# Patient Record
Sex: Female | Born: 1965 | Race: Black or African American | Hispanic: No | Marital: Married | Smoking: Current every day smoker
Health system: Southern US, Community
[De-identification: ages and names within clinical notes are randomized; demographics above are authoritative.]

## PROBLEM LIST (undated history)

## (undated) DIAGNOSIS — N200 Calculus of kidney: Secondary | ICD-10-CM

## (undated) DIAGNOSIS — E119 Type 2 diabetes mellitus without complications: Secondary | ICD-10-CM

## (undated) DIAGNOSIS — I10 Essential (primary) hypertension: Secondary | ICD-10-CM

## (undated) HISTORY — PX: OTHER SURGICAL HISTORY: SHX169

---

## 2004-08-22 ENCOUNTER — Emergency Department: Payer: Self-pay | Admitting: Emergency Medicine

## 2005-03-26 ENCOUNTER — Emergency Department: Payer: Self-pay | Admitting: Internal Medicine

## 2005-12-25 ENCOUNTER — Emergency Department: Payer: Self-pay

## 2006-06-14 ENCOUNTER — Emergency Department: Payer: Self-pay | Admitting: Emergency Medicine

## 2006-07-27 ENCOUNTER — Emergency Department: Payer: Self-pay | Admitting: General Practice

## 2006-12-06 ENCOUNTER — Observation Stay: Payer: Self-pay | Admitting: Urology

## 2008-02-13 ENCOUNTER — Emergency Department: Payer: Self-pay | Admitting: Emergency Medicine

## 2008-03-02 ENCOUNTER — Emergency Department: Payer: Self-pay | Admitting: Emergency Medicine

## 2008-05-31 ENCOUNTER — Emergency Department: Payer: Self-pay | Admitting: Emergency Medicine

## 2008-07-04 ENCOUNTER — Emergency Department: Payer: Self-pay | Admitting: Emergency Medicine

## 2008-08-19 ENCOUNTER — Emergency Department: Payer: Self-pay | Admitting: Emergency Medicine

## 2008-09-13 ENCOUNTER — Emergency Department: Payer: Self-pay | Admitting: Emergency Medicine

## 2011-05-02 ENCOUNTER — Emergency Department: Payer: Self-pay | Admitting: Emergency Medicine

## 2011-07-29 ENCOUNTER — Emergency Department: Payer: Self-pay | Admitting: Emergency Medicine

## 2013-06-27 ENCOUNTER — Inpatient Hospital Stay: Payer: Self-pay | Admitting: Internal Medicine

## 2013-06-27 LAB — URINALYSIS, COMPLETE
Bacteria: NONE SEEN
Bilirubin,UR: NEGATIVE
Glucose,UR: NEGATIVE mg/dL (ref 0–75)
Ketone: NEGATIVE
Leukocyte Esterase: NEGATIVE
Nitrite: NEGATIVE
Ph: 5 (ref 4.5–8.0)
RBC,UR: 4 /HPF (ref 0–5)
Specific Gravity: 1.018 (ref 1.003–1.030)
Squamous Epithelial: 5
WBC UR: 5 /HPF (ref 0–5)

## 2013-06-27 LAB — COMPREHENSIVE METABOLIC PANEL
Albumin: 2.8 g/dL — ABNORMAL LOW (ref 3.4–5.0)
Alkaline Phosphatase: 135 U/L — ABNORMAL HIGH
Anion Gap: 14 (ref 7–16)
BILIRUBIN TOTAL: 3 mg/dL — AB (ref 0.2–1.0)
BUN: 19 mg/dL — ABNORMAL HIGH (ref 7–18)
CALCIUM: 7.9 mg/dL — AB (ref 8.5–10.1)
CHLORIDE: 104 mmol/L (ref 98–107)
CO2: 17 mmol/L — AB (ref 21–32)
Creatinine: 1.58 mg/dL — ABNORMAL HIGH (ref 0.60–1.30)
EGFR (Non-African Amer.): 38 — ABNORMAL LOW
GFR CALC AF AMER: 44 — AB
GLUCOSE: 139 mg/dL — AB (ref 65–99)
Osmolality: 275 (ref 275–301)
Potassium: 3.9 mmol/L (ref 3.5–5.1)
SGOT(AST): 96 U/L — ABNORMAL HIGH (ref 15–37)
SGPT (ALT): 85 U/L — ABNORMAL HIGH (ref 12–78)
Sodium: 135 mmol/L — ABNORMAL LOW (ref 136–145)
Total Protein: 6.5 g/dL (ref 6.4–8.2)

## 2013-06-27 LAB — CBC
HCT: 28.4 % — ABNORMAL LOW (ref 35.0–47.0)
HGB: 9.6 g/dL — AB (ref 12.0–16.0)
MCH: 28.2 pg (ref 26.0–34.0)
MCHC: 33.8 g/dL (ref 32.0–36.0)
MCV: 83 fL (ref 80–100)
Platelet: 155 10*3/uL (ref 150–440)
RBC: 3.4 10*6/uL — ABNORMAL LOW (ref 3.80–5.20)
RDW: 12.9 % (ref 11.5–14.5)
WBC: 28.7 10*3/uL — AB (ref 3.6–11.0)

## 2013-06-27 LAB — PREGNANCY, URINE: PREGNANCY TEST, URINE: NEGATIVE m[IU]/mL

## 2013-06-27 LAB — LIPASE, BLOOD: LIPASE: 51 U/L — AB (ref 73–393)

## 2013-06-27 LAB — GC/CHLAMYDIA PROBE AMP

## 2013-06-28 ENCOUNTER — Ambulatory Visit: Payer: Self-pay

## 2013-06-28 LAB — BASIC METABOLIC PANEL
Anion Gap: 6 — ABNORMAL LOW (ref 7–16)
BUN: 13 mg/dL (ref 7–18)
CALCIUM: 7.7 mg/dL — AB (ref 8.5–10.1)
CO2: 23 mmol/L (ref 21–32)
CREATININE: 1.13 mg/dL (ref 0.60–1.30)
Chloride: 110 mmol/L — ABNORMAL HIGH (ref 98–107)
GFR CALC NON AF AMER: 57 — AB
Glucose: 146 mg/dL — ABNORMAL HIGH (ref 65–99)
OSMOLALITY: 280 (ref 275–301)
POTASSIUM: 3.1 mmol/L — AB (ref 3.5–5.1)
SODIUM: 139 mmol/L (ref 136–145)

## 2013-06-28 LAB — CBC WITH DIFFERENTIAL/PLATELET
BASOS PCT: 0.2 %
Basophil #: 0 10*3/uL (ref 0.0–0.1)
EOS PCT: 0 %
Eosinophil #: 0 10*3/uL (ref 0.0–0.7)
HCT: 22.1 % — AB (ref 35.0–47.0)
HGB: 7.5 g/dL — AB (ref 12.0–16.0)
Lymphocyte #: 1.6 10*3/uL (ref 1.0–3.6)
Lymphocyte %: 9.3 %
MCH: 28.3 pg (ref 26.0–34.0)
MCHC: 34.1 g/dL (ref 32.0–36.0)
MCV: 83 fL (ref 80–100)
MONOS PCT: 6.1 %
Monocyte #: 1.1 x10 3/mm — ABNORMAL HIGH (ref 0.2–0.9)
Neutrophil #: 14.8 10*3/uL — ABNORMAL HIGH (ref 1.4–6.5)
Neutrophil %: 84.4 %
PLATELETS: 119 10*3/uL — AB (ref 150–440)
RBC: 2.66 10*6/uL — ABNORMAL LOW (ref 3.80–5.20)
RDW: 12.9 % (ref 11.5–14.5)
WBC: 17.6 10*3/uL — ABNORMAL HIGH (ref 3.6–11.0)

## 2013-06-28 LAB — HEPATIC FUNCTION PANEL A (ARMC)
AST: 30 U/L (ref 15–37)
Albumin: 2 g/dL — ABNORMAL LOW (ref 3.4–5.0)
Alkaline Phosphatase: 107 U/L
BILIRUBIN TOTAL: 1.2 mg/dL — AB (ref 0.2–1.0)
Bilirubin, Direct: 0.6 mg/dL — ABNORMAL HIGH (ref 0.00–0.20)
SGPT (ALT): 49 U/L (ref 12–78)
TOTAL PROTEIN: 5.8 g/dL — AB (ref 6.4–8.2)

## 2013-06-29 LAB — CBC WITH DIFFERENTIAL/PLATELET
BASOS PCT: 0.3 %
Basophil #: 0 10*3/uL (ref 0.0–0.1)
EOS PCT: 0.3 %
Eosinophil #: 0 10*3/uL (ref 0.0–0.7)
HCT: 20 % — AB (ref 35.0–47.0)
HGB: 6.9 g/dL — AB (ref 12.0–16.0)
Lymphocyte #: 2.7 10*3/uL (ref 1.0–3.6)
Lymphocyte %: 19.3 %
MCH: 28.8 pg (ref 26.0–34.0)
MCHC: 34.3 g/dL (ref 32.0–36.0)
MCV: 84 fL (ref 80–100)
Monocyte #: 0.7 x10 3/mm (ref 0.2–0.9)
Monocyte %: 5.2 %
NEUTROS ABS: 10.3 10*3/uL — AB (ref 1.4–6.5)
NEUTROS PCT: 74.9 %
Platelet: 107 10*3/uL — ABNORMAL LOW (ref 150–440)
RBC: 2.38 10*6/uL — AB (ref 3.80–5.20)
RDW: 13 % (ref 11.5–14.5)
WBC: 13.8 10*3/uL — ABNORMAL HIGH (ref 3.6–11.0)

## 2013-06-29 LAB — COMPREHENSIVE METABOLIC PANEL
ANION GAP: 5 — AB (ref 7–16)
Albumin: 1.7 g/dL — ABNORMAL LOW (ref 3.4–5.0)
Alkaline Phosphatase: 84 U/L
BILIRUBIN TOTAL: 0.6 mg/dL (ref 0.2–1.0)
BUN: 11 mg/dL (ref 7–18)
CREATININE: 1.13 mg/dL (ref 0.60–1.30)
Calcium, Total: 7.6 mg/dL — ABNORMAL LOW (ref 8.5–10.1)
Chloride: 113 mmol/L — ABNORMAL HIGH (ref 98–107)
Co2: 20 mmol/L — ABNORMAL LOW (ref 21–32)
EGFR (African American): >60
EGFR (Non-African Amer.): 57 — ABNORMAL LOW
Glucose: 130 mg/dL — ABNORMAL HIGH (ref 65–99)
OSMOLALITY: 277 (ref 275–301)
POTASSIUM: 3.3 mmol/L — AB (ref 3.5–5.1)
SGOT(AST): 23 U/L (ref 15–37)
SGPT (ALT): 33 U/L (ref 12–78)
SODIUM: 138 mmol/L (ref 136–145)
Total Protein: 5.5 g/dL — ABNORMAL LOW (ref 6.4–8.2)

## 2013-06-29 LAB — URINE CULTURE

## 2013-06-30 LAB — HEMOGLOBIN: HGB: 8.1 g/dL — ABNORMAL LOW (ref 12.0–16.0)

## 2013-06-30 LAB — MAGNESIUM: Magnesium: 1.7 mg/dL — ABNORMAL LOW

## 2013-06-30 LAB — POTASSIUM: Potassium: 3.6 mmol/L (ref 3.5–5.1)

## 2013-07-01 LAB — CREATININE, SERUM
Creatinine: 0.92 mg/dL (ref 0.60–1.30)
EGFR (Non-African Amer.): >60

## 2013-07-01 LAB — RAPID HIV-1/2 QL/CONFIRM: HIV-1/2,Rapid Ql: NEGATIVE

## 2013-07-01 LAB — HEMOGLOBIN: HGB: 8.7 g/dL — ABNORMAL LOW (ref 12.0–16.0)

## 2013-07-01 LAB — CULTURE, BLOOD (SINGLE)

## 2013-07-02 LAB — VANCOMYCIN, TROUGH: Vancomycin, Trough: 4 ug/mL — ABNORMAL LOW (ref 10–20)

## 2013-07-08 ENCOUNTER — Ambulatory Visit: Payer: Self-pay | Admitting: Urology

## 2013-11-29 ENCOUNTER — Emergency Department: Payer: Self-pay | Admitting: Emergency Medicine

## 2014-06-09 ENCOUNTER — Emergency Department: Payer: Self-pay | Admitting: Emergency Medicine

## 2014-08-24 ENCOUNTER — Emergency Department: Payer: Self-pay | Admitting: Emergency Medicine

## 2014-09-28 ENCOUNTER — Inpatient Hospital Stay
Admit: 2014-09-28 | Disposition: A | Discharge status: Home / Self Care | Payer: Self-pay | Attending: Internal Medicine | Admitting: Internal Medicine

## 2014-09-28 LAB — COMPREHENSIVE METABOLIC PANEL
ALK PHOS: 103 U/L
ANION GAP: 9 (ref 7–16)
Albumin: 4.4 g/dL
BILIRUBIN TOTAL: 0.2 mg/dL — AB
BUN: 16 mg/dL
CHLORIDE: 107 mmol/L
CREATININE: 1.14 mg/dL — AB
Calcium, Total: 9.5 mg/dL
Co2: 23 mmol/L
EGFR (African American): >60
EGFR (Non-African Amer.): 56 — ABNORMAL LOW
GLUCOSE: 116 mg/dL — AB
POTASSIUM: 3.8 mmol/L
SGOT(AST): 23 U/L
SGPT (ALT): 23 U/L
Sodium: 139 mmol/L
TOTAL PROTEIN: 8.1 g/dL

## 2014-09-28 LAB — CBC WITH DIFFERENTIAL/PLATELET
BASOS PCT: 0.8 %
Basophil #: 0.1 10*3/uL (ref 0.0–0.1)
Eosinophil #: 0.5 10*3/uL (ref 0.0–0.7)
Eosinophil %: 3.7 %
HCT: 43.1 % (ref 35.0–47.0)
HGB: 14.4 g/dL (ref 12.0–16.0)
LYMPHS ABS: 5.7 10*3/uL — AB (ref 1.0–3.6)
LYMPHS PCT: 41.3 %
MCH: 28.1 pg (ref 26.0–34.0)
MCHC: 33.5 g/dL (ref 32.0–36.0)
MCV: 84 fL (ref 80–100)
MONO ABS: 0.8 x10 3/mm (ref 0.2–0.9)
Monocyte %: 5.5 %
Neutrophil #: 6.7 10*3/uL — ABNORMAL HIGH (ref 1.4–6.5)
Neutrophil %: 48.7 %
Platelet: 294 10*3/uL (ref 150–440)
RBC: 5.12 10*6/uL (ref 3.80–5.20)
RDW: 12.6 % (ref 11.5–14.5)
WBC: 13.7 10*3/uL — ABNORMAL HIGH (ref 3.6–11.0)

## 2014-09-28 LAB — URINALYSIS, COMPLETE
BLOOD: NEGATIVE
Bacteria: NONE SEEN
Bilirubin,UR: NEGATIVE
Glucose,UR: NEGATIVE mg/dL (ref 0–75)
Ketone: NEGATIVE
Leukocyte Esterase: NEGATIVE
NITRITE: NEGATIVE
PROTEIN: NEGATIVE
Ph: 7 (ref 4.5–8.0)
SPECIFIC GRAVITY: 1.015 (ref 1.003–1.030)

## 2014-09-29 LAB — BASIC METABOLIC PANEL
ANION GAP: 0 — AB (ref 7–16)
BUN: 14 mg/dL
CREATININE: 1.12 mg/dL — AB
Calcium, Total: 8.7 mg/dL — ABNORMAL LOW
Chloride: 111 mmol/L
Co2: 26 mmol/L
EGFR (African American): >60
GFR CALC NON AF AMER: 58 — AB
GLUCOSE: 96 mg/dL
Potassium: 4 mmol/L
Sodium: 137 mmol/L

## 2014-09-29 LAB — MAGNESIUM: Magnesium: 1.9 mg/dL

## 2014-09-30 LAB — CBC WITH DIFFERENTIAL/PLATELET
BASOS PCT: 0.3 %
Basophil #: 0 10*3/uL (ref 0.0–0.1)
Eosinophil #: 0.4 10*3/uL (ref 0.0–0.7)
Eosinophil %: 4.3 %
HCT: 37.4 % (ref 35.0–47.0)
HGB: 12.6 g/dL (ref 12.0–16.0)
LYMPHS PCT: 48 %
Lymphocyte #: 4.3 10*3/uL — ABNORMAL HIGH (ref 1.0–3.6)
MCH: 28.4 pg (ref 26.0–34.0)
MCHC: 33.6 g/dL (ref 32.0–36.0)
MCV: 85 fL (ref 80–100)
Monocyte #: 0.5 x10 3/mm (ref 0.2–0.9)
Monocyte %: 5.3 %
NEUTROS PCT: 42.1 %
Neutrophil #: 3.8 10*3/uL (ref 1.4–6.5)
Platelet: 235 10*3/uL (ref 150–440)
RBC: 4.43 10*6/uL (ref 3.80–5.20)
RDW: 12.7 % (ref 11.5–14.5)
WBC: 9 10*3/uL (ref 3.6–11.0)

## 2014-09-30 LAB — URINE CULTURE

## 2014-10-03 NOTE — H&P (Signed)
PATIENT NAME:  Cassandra, Graves MR#:  [id number] DATE OF BIRTH:  1965-12-26  DATE OF ADMISSION:  06/27/2013  PRIMARY CARE PHYSICIAN: None.   EMERGENCY ROOM PHYSICIAN: Dr. Thomasene Lot.   CHIEF COMPLAINT: Abdominal pain and cough and uterine bleeding.   HISTORY OF PRESENT ILLNESS: A 49 year old female patient complains of uterine bleeding for two months. She also has cough and shortness of breath for 2 months, but according to the mother today morning, she was very weak and also tired, having fever, and because of this abdominal pain, cough, shortness of breath and fever and also generalized weakness, the patient was brought in here. The patient was at [facility] on Friday because of her uterine bleeding and she was told that she has a fibroid or cyst, and she went for follow-up on Tuesday. She still has bleeding source. She was given progesterone tablets on Tuesday. The patient is taking them but according to the mother, she is still having bleeding.   The patient also noted to have abdominal pain and cough and shortness of breath. The patient says that she is having trouble breathing and cough for about 2 months. The patient noted to have some blood in her cough today.   No chest pain and the patient has 100.1 fever today in the ER. The patient denies any diarrhea. No nausea. No vomiting. The patient has no chest pains and no problems with dysuria.   PAST MEDICAL HISTORY: Significant for history of kidney stones before. She had shockwave lithotripsy before at [facility]. She also had a history of ureteral stone removed and also stents placed and that also removed 2 years ago at [facility].   SOCIAL HISTORY: A history of smoking before but quit now. No alcohol. No drugs. She has 5 kids. She works at [employer].   PAST SURGICAL HISTORY: C-section and also shockwave lithotripsy.   MEDICATIONS: Progesterone tablets but no other medication.   FAMILY HISTORY: No hypertension or diabetes.   ALLERGIES: PENICILLIN   REVIEW OF  SYSTEMS: CONSTITUTIONAL: Had a fever and generalized weakness.  EYES: No blurred vision.  ENT: No tinnitus. No epistaxis. No difficulty swallowing.  RESPIRATIONS: Has cough for 2 months. The patient also complains of trouble breathing.  CARDIOVASCULAR: No chest pain. No orthopnea. No pedal edema. No palpitations.  GASTROINTESTINAL: Has abdominal pain mainly on the right side, and the patient has no diarrhea. No constipation. No rectal bleeding.  GENITOURINARY: No dysuria. Has a history of kidney stones before.  ENDOCRINE: No polyuria or nocturia.  HEMATOLOGIC: Has anemia. The patient has uterine bleeding going on for 2 months.  MUSCULOSKELETAL: No joint pain.  NEUROLOGIC: No numbness or weakness.  PSYCHIATRIC: No anxiety or insomnia.   PHYSICAL EXAMINATION:  VITAL SIGNS: Temperature is 100.1, heart rate 117, blood pressure 103/55, sats 97% room air. GENERAL: A 49 year old female who is looking very weak and clinically looks very dehydrated.  HEENT: PERRLA. EOM intact. The patient has no conjunctivitis. Hearing is intact. Mucous membranes are dry. The patient has no pharyngeal erythema and tympanic membranes are clear.  NECK: Supple. No JVD. No carotid bruit. No lymphadenopathy. No masses.  RESPIRATIONS: Clear to auscultation. No wheezing. No rales. The patient has slightly decreased breath sounds on the right side.  CARDIOVASCULAR: S1, S2 regular, tachycardic. PMI nondisplaced. The patient has no thrills, good pedal pulses, femoral pulse, and no extremity edema.  ABDOMEN: Right upper quadrant tenderness present. No hepatosplenomegaly. Bowel sounds present. No CVA tenderness. No hernias.   MUSCULOSKELETAL: Able to move  extremities x4.  SKIN: decreased skin turgor NEUROLOGICAL: Alert and oriented x 1. Cranial nerves II through XII are intact. Power five out of five upper and lower extremities. The patient had no dysarthria or aphasia. No contractures.  PSYCHIATRIC: Oriented to time, place,  person and cooperative.   LABORATORY AND DIAGNOSTIC DATA: CT of abdomen shows abdominal density in the left lower lobe consistent with pneumonia. Liver is normal. No calcified gallstones. Kidney is normal. Pancreas is normal. Adrenal glands are normal. Left kidney has 2 mm nonobstructing stone in the midportion, 5 mm nonobstructing stone in the lower pole. The patient also has the appearance of renal atrophy. Right kidney has focal scarring and calyceal ectasia. Nonobstructing stone in the upper pole is about 9 mm in diameter. There is also fullness in the right renal collecting system and ureter to the level of the pelvic brim which is a 4 mm stone.   Patchy density in the left lower lobe concerning for pneumonia. The patient has right ureteral stone which is 4 mm in size with mild hydronephrosis.   The patient also has somewhat enlarged areas of low density in the uterus which is a leiomyoma which could be a necrotic leiomyoma with inflammatory changes.   Lactic acid is 3.9.   Abdominal x-ray shows nonobstructive bowel gas pattern and nephrolithiasis.   WBC 28.7, hemoglobin 9.6, hematocrit 28.4, platelets 155.   ELECTROLYTES: Sodium 135, potassium 3.9, chloride 104, bicarb 17, BUN 19, creatinine 1.58, glucose 139. The patient's ALT is 85, AST 96, alk phos 135.   Albumin is 2.8. Lipase 51.   Urinalysis is clear with WBC only 5 and 2+ blood in there. Hazy-colored urine and also no bacteria.   EKG not done in the ER. I am going to order one.  ASSESSMENT AND PLAN: The patient is a 49 year old female patient with uterine bleeding, abdominal pain, elevated white count, lactic acidosis. 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 The patient was given admission for pneumonia.   1.  The patient has pneumonia definitely and also probably might have some endometriosis or infected fibroid. The patient has sepsis on admission secondary to pneumonia and also possible intra-abdominal process. The  patient will be admitted to medicine. Will give IV fluids, normal saline at 150 mL/hour. The patient has lactic acidosis with low bicarbonate. She will be getting IV fluids aggressively and continue vancomycin and Levaquin. Follow the blood cultures and see how she does.  2.  Possible intra-abdominal infection with possible endometriosis versus leiomyoma. The patient will be seen by OB/GYN. Get the record from [place] as she was there a week ago. The patient still has uterine bleeding, so I put in a consult for OB/GYN to see the patient.  3.  The patient has ureteral stone with mild hydronephrosis.  Discussed with Dr. Jacqlyn Larsen who thinks that this stone is not really causing a problem as urinalysis is clear. The patient will be started on Flomax, continue aggressive hydration. The patient has a history of kidney stones before, but at this time, acute renal failure probably due to sepsis. Continue fluids and monitor kidney function. Dr. Jacqlyn Larsen recommended to continue Flomax but no other intervention.  4.  Elevated LFTs. At this time, there is no clear reason for the elevation of LFTs, so at this time, I think it is probably due to sepsis. Follow the trend and see how she does   TIME SPENT: About 60 minutes. This is a critical history and physical.  The patient's condition discussed with the mother.   ____________________________ Epifanio Lesches, MD sk:np D: 06/27/2013 14:39:07 ET T: 06/27/2013 15:04:46 ET JOB#: [id number]  cc: Epifanio Lesches, MD, <Dictator> Epifanio Lesches MD ELECTRONICALLY SIGNED 07/22/2013 14:43

## 2014-10-03 NOTE — H&P (Signed)
PATIENT NAME:  Cassandra Graves, Cassandra Graves MR#:  [id number] DATE OF BIRTH:  1965/08/18  DATE OF ADMISSION:  06/27/2013  ADDENDUM: The patient's EKG shows sinus tachycardia, 118 beats per minute. No ST-T changes.  ____________________________ Katha HammingSnehalatha Aliyanna Wassmer, MD sk:sb D: 06/27/2013 14:45:34 ET T: 06/27/2013 15:09:07 ET JOB#: [id number]  cc: Katha HammingSnehalatha Olia Hinderliter, MD, <Dictator> Katha HammingSNEHALATHA Ayush Boulet MD ELECTRONICALLY SIGNED 07/22/2013 14:40

## 2014-10-03 NOTE — Consult Note (Signed)
Chief Complaint:  Subjective/Chief Complaint diffuse abdominal pain, weakness and coughing patient has had some chills over the last 24 hours no urinary symptoms, no dysuria or hematuria continues to have vaginal bleeding   VITAL SIGNS/ANCILLARY NOTES: **Vital Signs.:   17-Jan-15 05:00  Vital Signs Type Routine  Temperature Temperature (F) 98.8  Celsius 37.1  Temperature Source oral  Pulse Pulse 95  Respirations Respirations 20  Systolic BP Systolic BP 94  Diastolic BP (mmHg) Diastolic BP (mmHg) 59  Mean BP 70  Pulse Ox % Pulse Ox % 94  Pulse Ox Activity Level  At rest  Oxygen Delivery Room Air/ 21 %  *Intake and Output.:   17-Jan-15 00:26  Grand Totals Intake:   Output:  300    Net:  -300 24 Hr.:  -930  Urine ml     Out:  300  Urinary Method  Void; Up to BR  Stool  small loose BM    04:25  Grand Totals Intake:   Output:  400    Net:  -400 24 Hr.:  -1330  Urine ml     Out:  400  Urinary Method  Void; Up to [facility]    Shift 07:00  [facility] Intake:   Output:  700    Net:  -700 24 Hr.:  -1330  Urine ml     Out:  700  Length of Stay Totals Intake:  120 Output:  1450    Net:  -1330    Daily 07:00  Grand Totals Intake:  120 Output:  1450    Net:  -1330 24 Hr.:  -1330  Oral Intake      In:  120  Urine ml     Out:  1450  Length of Stay Totals Intake:  120 Output:  1450    Net:  -1330    07:30  Unmeasured Intake  Sips; Few bites   Brief Assessment:  GEN well developed, well nourished, uncomfortable   Cardiac Regular   Respiratory normal resp effort   Gastrointestinal details normal Soft  No gaurding  No rigidity  diffuse tenderness on palpation   EXTR negative edema   Additional Physical Exam no CVA tenderness   Radiology Results: CT:    16-Jan-15 13:12, CT Abdomen and Pelvis With Contrast  CT Abdomen and Pelvis With Contrast   REASON FOR EXAM:    (1) abd pain - RLQ, weakness, pallor, WBC 28k; (2)   abd pain - RLQ, weakness, pal  COMMENTS:        PROCEDURE: CT  - CT ABDOMEN / PELVIS  W  - Jun 27 2013  1:12PM     CLINICAL DATA:  Nausea and vomiting with weakness. Vaginal bleeding.  Elevated white count.    EXAM:  CT ABDOMEN AND PELVIS WITH CONTRAST    TECHNIQUE:  Multidetector CT imaging of the abdomen and pelvis was performed  using the standard protocol following bolus administration of  intravenous contrast.    CONTRAST:  100 cc Isovue 370    COMPARISON:  05/31/2008    FINDINGS:  There is abnormal density in the left lower lobe consistent with  pneumonia.    The liver has a normal appearance without focal lesions or biliary  ductal dilatation. No calcified gallstones. The spleen is normal.  The pancreas is normal. The adrenal glands are normal. Left kidney  contains a 2 mm nonobstructing stone in the midportion and a 5 mm  non obstructing stone in the lower pole. There  are a few areas of  focal renal atrophy/ scarring. The right kidney shows areas of focal  scarring with caliceal ectasia. There is a nonobstructing stone in  the upper pole measuring 9 mm in diameter. There is mild fullness of  the right renal collecting system and ureter to the level of the  pelvic brim where there is a 4 mm stone.    The aorta shows atherosclerosis but no aneurysm. The IVC is normal.  Uterus appears somewhat enlarged with areas of low density that are  nonspecific. These could represent necrotic leiomyoma is or  inflammatory change. Ultrasound would evaluate that more accurately.  No free fluid in the pelvis. No significant bony finding. The  appendix is well seen as a normal structure. No other bowel finding.     IMPRESSION:  Patchy density in the left lower lobe consistent with pneumonia.  Right ureteral stone at the pelvic brim measuring 4 mm in size with  mild hydroureteronephrosis on the right.    Nonobstructing calculi in both kidneys. Areas of renal scarring  bilaterally.      Electronically Signed    EA:VWUJBy:Mark   Shogry M.D.    On: 06/27/2013 13:24         Verified By: Thomasenia SalesMARK E. SHOGRY, M.D.,   Assessment/Plan:  Invasive Device Daily Assessment of Necessity:  Does the patient currently have any of the following indwelling devices? none   Assessment/Plan:  Assessment 49 yo female admitted for abdominal pain, cough, weakness and tachycardia. CT scan demonstrated 4mm right distal ureteral stone as well as possible left lower lobe pneumonia and enlarged uterus with questionable necrotic/inflammatory area.   Patient with low grade fever overnight to 100.7 and continued abdominal pain and nausea.  Patient also with positive blood cultures 2/2 with gram positive cocci.  1. Left lower lobe pna- recommend PA/LAT CXR.  Continue abx and manage per primary team.  Patient complaining of cough for last several days  2. Abnormal vaginal bleeding- gyn consulted and recommend outpatient f/u for fibroid and malignancy work up.  Unlikely source of infection per consult note  3. Elevated creatinine- improved to 1.13 with hydration.   4. Elevated WBC- 28-->17, gram positive cocci in chains in blood.  Continue abx.  Urinalysis and urine culture negative   5. Right distal ureteral stone- mild hydronephrosis, creatinine normal and no CVA tenderness on my exam.   Patient with complicated picture of infection with bacteremia and low grade fevers.  Unclear what the source of the infection is but patient does have distal ureteral stone and this could be source.    Recommend: NPO, plan for OR on Sunday for cystoscopy, right retrograde pyelogram and right ureteral stent.  Continue to monitor for worsening symptoms that would require urgent intervention.  This was discussed with the patient and her husband.  Informed consent placed in the chart.   Electronic Signatures: Janit BernMcNamara, Tamsen Reist R (MD)  (Signed 17-Jan-15 11:21)  Authored: Chief Complaint, VITAL SIGNS/ANCILLARY NOTES, Brief Assessment, Radiology Results,  Assessment/Plan   Last Updated: 17-Jan-15 11:21 by Janit BernMcNamara, Aaban Griep R (MD)

## 2014-10-03 NOTE — H&P (Signed)
PATIENT NAME:  Cassandra MelterMAPP, Buffey E MR#:  [id number] DATE OF BIRTH:  April 11, 1966  DATE OF ADMISSION:  06/27/2013  ADDENDUM  The patient's EKG shows sinus tachycardia, 118 beats per minute. No ST-T changes.  ____________________________ Katha HammingSnehalatha Skylar Flynt, MD sk:jcm D: 06/27/2013 14:45:34 ET T: 06/27/2013 14:57:25 ET JOB#: [id number]  cc: Katha HammingSnehalatha Amantha Sklar, MD, <Dictator> Katha HammingSNEHALATHA Kanchan Gal MD ELECTRONICALLY SIGNED 06/27/2013 17:53

## 2014-10-03 NOTE — Consult Note (Signed)
Brief Consult Note: Diagnosis: abnormal uterine bleeding, sepsis, acute renal failure.   Patient was seen by consultant.   Consult note dictated.   Recommend further assessment or treatment.   Orders entered.   Comments: Patient has [facility]  [facility] and [facility] are not concerning for infection.  Doubt gynecologic etiology to her infection.  Differential diagnosis would include endometritis and PID (exam by me not consistent with either).  Gynecologic malignancy is unlikely to present with sepsis and she has reassuring ultrasound findings.  Would consider other source as etiology for infection, unless new symptoms or signs become apparent.  Abnormal uterine bleeding: Her fibroids are certainly a possible cause of her bleeding.  Malignancy should also be considered.  However, this is usually accomplished in the outpatient setting.  She states that she has [facility] as an outpatient for follow up. If she develops heavy bleeding, please give us a call and we can help recommend medication and dosing with regard to her lates hepatic and renal function indices.  Thank you for this consult.  Electronic Signatures: Conard NovakJackson, Nhyira Leano D (MD)  (Signed 16-Jan-15 22:44)  Authored: Brief Consult Note   Last Updated: 16-Jan-15 22:44 by Conard NovakJackson, Tavis Kring D (MD)

## 2014-10-03 NOTE — Discharge Summary (Signed)
PATIENT NAME:  Cassandra Graves, Cassandra Graves MR#:  [id number] DATE OF BIRTH:  [birth year]  DATE OF ADMISSION:  06/27/2013 DATE OF DISCHARGE:  01/221/2015  CONSULTANTS: Dr. Achilles Dunk and  Dr. Alyce Pagan from urology. Dr. Jean Rosenthal from OB/GYN. Dr. Sampson Goon from infectious disease.   CHIEF COMPLAINT: Abdominal pain, cough, uterine bleeding.   PRIMARY CARE PHYSICIAN: None.   DISCHARGE DIAGNOSES: 1. Sepsis.  2. Strep pyogenes bacteremia.  3. Pneumonia.  4. Dysfunctional uterine bleeding.  5. Postobstructive hemorrhagic acute anemia status post a unit of PRBC transfusion.  6. Hypokalemia. 7. Mild acute renal failure, resolved.  8. Right ureteral stone with  right hydronephrosis.  9. Nephrolithiasis.  10. Transient hypotension.   DISCHARGE MEDICATIONS: Levaquin 500 mg p.o. daily for 10 days, Flomax 0.4 mg daily for 10 days, ferrous sulfate 325 mg 2 times a day, Sprintec 35 mcg/0.25 mg orally as previously dosed.   DIET: Regular.   ACTIVITY: As tolerated.   FOLLOWUP: Please follow with Dr. Achilles Dunk in about a week. Please follow with Dr. Sampson Goon on the 30th. Please follow with [facility]GYN at [facility] as previously scheduled by you on first week of February.    DISPOSITION: Home.   SIGNIFICANT LABS AND IMAGING: Initial BUN 19, creatinine 1.58, sodium 135, lowest potassium was 3.1. Initial bilirubin was at 3, alkaline phosphatase 135, AST 96, ALT 85. By January 18th had normalized. Lipase on arrival 51. Initial white count of 28,000 with hemoglobin 9.6, platelets 155. The lowest hemoglobin was 6.9, last hemoglobin of 8.7. Urine cultures no growth to date. Initial blood cultures showed pyogenous 2/2 bottles. Chlamydia negative. HIV negative. UA not suggestive of infection. Pregnancy test negative. Initial lactic acid of 3.9. Initial CT abdomen and pelvis with contrast showing patchy density in the left lower lobe consistent with pneumonia, right ureteral stone with pelvic brim measuring 4 mm with mild hydronephrosis on the  right, nonobstructing calculi in both kidneys. Areas of renal scarring bilaterally. Pelvic ultrasound showing mildly enlarged uterus with heterogeneous echotexture and fibroids in the anterior aspect of the fundus and the posterior aspect of the lower uterine segment. Endometrial stripe does not appear abnormally thickened. The left ovary could not be demonstrated. Right ovary is normal in appearance. Abdominal ultrasound no evidence of acute cholecystitis nor any acute gallbladder abnormalities. Mild hydronephrosis.   HISTORY OF PRESENT ILLNESS AND HOSPITAL COURSE: For full details of H and P, please see the dictation on day of admission by Dr. Luberta Mutter. But briefly, this is a 49 year old female who came in for shortness of breath a couple months,  weakness, lethargy, fevers, abdominal pains. She had been at [facility] on Friday for uterine bleeding and she was told that she had a fibroid or cyst and went for follow-up on Tuesday. She had bleeding still. She was given  progesterone tablets. She came in for abdominal pain. She had low-grade temperature of 100.1 in the ER and also had complained of a cough. But she had a CT done of the abdomen as dictated above which did show pneumonia. She had elevated lactic acid and was started on IV antibiotics with aggressive IV fluid resuscitation. She is also noted to have a urethral stone. Urology was consulted in addition to OB/GYN.   In regards to the sepsis, this was secondary to pneumonia, as well as strep pyogenes  bacteremia. Two blood cultures came back positive for strep and infectious disease was consulted. The sepsis has resolved. She is no longer febrile. Her white count has significantly decreased and the patient  clinically is better, and per infectious disease she will be discharged on Levaquin for additional 10 days. She is PENICILLIN ALLERGIC and therefore the Levaquin, and we will attempt to refill the prescription for her. She has no PCP. Infectious disease will  see her on the 30th of an outpatient. In regards to the pneumonia, she is on room air at this point and that has significantly improved. We did an echocardiogram which did not suggest endocarditis.   In regards to elevated transaminases with a right upper quadrant pain, we did an abdominal ultrasound. That was nondiagnostic. I suspected that the pain could be secondary to the fibroids versus ureteral stone, which was seen in the CAT scan. At this point,  she has no further pain and the transaminases has corrected. It is possible that the elevated transaminases is secondary to sepsis.   In regards to the right ureteral stone. She does have some mild hydronephrosis. However,  kidney function is improved. She has not passed a stone yet. She was started on Flomax and she was seen by urology. There is no intervention planned at this time. She was seen by Dr. Achilles Dunkope yesterday and the plan is to follow as an outpatient in about a week and at that time it will be determined if she needs lithotripsy or ureteroscopic stone removal. If she develops any significant right-sided flank pain or significant symptoms related to her stone, other indications might be indicated at that time per urology, but at this point, she is not having  any stone pains.   In regards to her uterine bleeding. She was seen by OB/GYN  and that time has stopped. [facility] first week of February. 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 She did receive a unit of PRBC. She was counseled about following with [facility] and really make an attempt to make appointments with Dr. Achilles Dunkope and  Dr. Sampson GoonFitzgerald for her prior to discharge.   PHYSICAL EXAMINATION: VITAL SIGNS: On the day of discharge, temperature 98.5, pulse is 76, respiratory rate 18, systolic  blood pressure 131, oxygen saturation 95% on room air.  GENERAL: The patient is a  well-developed female lying in bed, eating, no obvious distress, talking in full sentences.  HEENT: Normocephalic, atraumatic. Moist mucous membranes.  HEART: Normal S1, S2 without murmurs.  LUNGS: Clear with and without, wheezing, or rhonchi.  ABDOMEN: Soft, nontender. Extremities exhibit no pitting edema.   CODE STATUS: THE PATIENT IS FULL CODE.   Total time spent on this discharge: 35 minutes.  ____________________________ Krystal EatonShayiq Mayank Teuscher, MD sa:sg D: 07/02/2013 13:23:00 ET T: 07/02/2013 13:45:13 ET JOB#: [id number]  cc: Madolyn FriezeBrian S. Achilles Dunkope, MD Stann Mainlandavid P. Sampson GoonFitzgerald, MD Krystal EatonShayiq Emidio Warrell, MD, <Dictator>   Krystal EatonSHAYIQ Riyad Keena MD ELECTRONICALLY SIGNED 07/26/2013 10:49

## 2014-10-03 NOTE — Consult Note (Signed)
PATIENT NAME:  Cassandra Graves, Cassandra Graves MR#:  [id number] DATE OF BIRTH:  1965/11/02  DATE OF CONSULTATION:  06/27/2013  REFERRING PHYSICIAN:  Epifanio Lesches, MD  CONSULTING PHYSICIAN:  Will Bonnet, MD  REASON FOR CONSULTATION:  Abnormal uterine bleeding.   HISTORY OF PRESENT ILLNESS:  Mrs. Schnick is a 49 year old G105P5 female who was admitted earlier today with severe sepsis, lactic acidosis, elevated white blood cell count, fever, acute renal failure, and pneumonia.  She also has a history of abnormal uterine bleeding.  The patient states that for the past two days she has had emesis and she has been able to keep very little down.  With emesis she has had loss of control of her bowels.  She otherwise has no issues with her bowels.  She notes no current abdominal pain though she states that she has a little soreness in her right lower quadrant from what she states is likely from her throwing up.  She denies hematochezia or hematemesis.  She states that she has been feeling feverish at home and she has had a cough for approximately one month for which she has been taking Dimetapp and that the cough has been improving.  She states that she has been seen at [facility] twice in the past week for heavy vaginal bleeding.  She was given medication to help with her bleeding which she states she started taking approximately two days ago.  She describes the medication as having to be taken 5 pills in one day for the first day, four pills the second day and she states that she started throwing up about the time that she started taking these pills.  From a menstrual standpoint she has had approximately two months of heavy vaginal bleeding.  Prior to that she had four months with no vaginal bleeding and then prior to that she would have bleeding some months yes and some months no, that would last for about two days.  She has no history of abnormal Pap smears though she cannot recall the last time that  she had a Pap smear.  She states she has no history of sexually transmitted infections.  She denies abnormal vaginal discharge and vaginal irritation.   PAST MEDICAL HISTORY:  1.  Notable for nephrolithiasis.  2.  Abnormal uterine bleeding with fibroids.   PAST SURGICAL HISTORY: 1.  Cesarean section x 2.  2.  ESWL.  3.  Ureteroscopy.  4.  Bilateral tubal ligation.   CURRENT MEDICATIONS:  1.  Combined oral contraceptive pills.  2.  Progesterone tablets.   ALLERGIES:  PENICILLIN, WHICH CAUSED HIVES.   OBSTETRICAL AND GYNECOLOGIC HISTORY:  The patient is a G5, P5-0-05.  She is vaginally parous x 3 with two cesarean sections.  Her youngest child is 85 years old.  Her Pap smear and STD history as per the HPI.   SOCIAL HISTORY:  Denies current tobacco, alcohol and drug use.  She currently works at [employer].   FAMILY HISTORY:  Notable for mother with colon cancer diagnosed at the age of 42.  She denies any other GYN cancers, breast cancers and other colon cancers in her family.    REVIEW OF SYSTEMS: Notable for generalized weakness and otherwise negative x 10 systems reviewed unless noted in the HPI.   PHYSICAL EXAMINATION: VITAL SIGNS:  Temperature 38.1 Celsius, pulse 105, blood pressure 125/78, respiratory rate 20, O2 saturation 95% on room air.  GENERAL:  The patient is a bit tired-appearing, but is  in no apparent acute distress.  HEENT:  Normocephalic and atraumatic.  NECK:  Trachea is midline.  No thyromegaly.  CARDIOVASCULAR:  Regular rate and rhythm.  PULMONARY:  Has restricted breathing pattern with diminished breath sounds at the bases.  ABDOMEN:  Obese, soft, is nontender, nondistended.  There is mild tenderness in the right lower quadrant.  No right upper quadrant tenderness.  Negative Murphy sign.  No rebound or guarding.  GENITOURINARY:  Pelvic exam reveals normal external female genitalia.  Normal Bartholin's, urethra and Skene glands.  The vaginal epithelium is pink and  normally rugated.  There is a small amount of dark old blood in the vaginal vault.  The cervix is visualized and has an old blood clot at the external os which is wiped away.  There were no obvious lesions and no active bright red bleeding from the cervical os.  On bimanual examination there is no cervical motion tenderness.  No lesions in the vagina or cervix and there is no uterine tenderness.  No adnexal fullness or tenderness. The uterine and adnexal assessment is somewhat limited by her body habitus.  EXTREMITIES:  No erythema, cords or tenderness.  SKIN:  There is decreased turgor.  NEUROLOGIC:  Grossly intact.   LABORATORY RESULTS:   1.  BMP significant for BUN of 19, creatinine of 1.6, sodium 135, potassium 3.9, chloride 104, EGFR 44, anion gap is 14, calcium 7.9, lipase 51.   2. LFTs with total bilirubin of 3.0, alkaline phosphatase 135, AST 96, ALT 85.  3.  CBC:  White blood cell count 28.7, hemoglobin 9.6, hematocrit 28.4, platelets 155, MCV is 83 4. lactic acid is 3.9.   IMAGING STUDIES:  (pertinent to gynecologic evaluation): 1.  CT scan obtained on 01/16 from report shows that the uterus appears somewhat enlarged with areas of low density that are nonspecific possibly representing necrotic leiomyoma or inflammatory change.  No free fluid in the pelvis.  2.  Pelvic ultrasound accomplished on 06/27/2013 from report shows the uterus measuring 10.1 x 5.4 x 6.8 cm.  Within the uterus anteriorly there is a presumed fibroid that measures 5.7 x 2.8 x 4.3 cm and posteriorly in the lower uterine segment there is a 1.2 x 1.2 x 1.3 cm diameter lesion that likely represents fibroid per report.  Endometrium is 6.9 mm with no focal abnormality visualized.  Right ovary measures 2.6 x 1 x 1.4 cm with no adnexal masses and abnormal appearance.  Left ovary could not be seen.  No free fluid noted in the pelvis.   ASSESSMENT AND RECOMMENDATIONS:  This is a 49 year old G5, P5 female who is admitted for severe  sepsis likely from pneumonia, renal failure, elevated lactic acid, and a history of abnormal uterine bleeding.   1.  Infection risk from gynecologic standpoint: She has heavy abnormal uterine bleeding for which she has been evaluated twice in the past week in addition to my evaluation.  Her ultrasound findings here and at [facility] are not concerning for infection.  I do doubt any gynecologic etiology for her infectious morbidity, though the differential diagnosis would include endometritis and pelvic inflammatory disease.  My exam is not consistent with either one.  Gynecologic malignancy may present with sepsis in certain situations.  However, she has reassuring ultrasound findings that are not suggestive of malignancy that would lead to an infection.  I would consider other sources and etiology of infection prior to considering gynecologic source unless new symptoms or signs become apparent.   2.  Abnormal uterine bleeding:  Her fibroids are certainly possible cause of her bleeding.  Malignancy should also be considered.  However, this is usually accomplished in an outpatient setting.  The patient does note that she has an appointment at [facility] on February 5th, next month as an outpatient for follow-up.  If she develops heavy bleeding, please feel free to call the on-call gynecologist for help with recommendation for medication and dosing depending on her latest hepatic and renal function indices, if needed.    Thank you very much for this consultation.  We appreciate the opportunity to participate in the care of your patient.      ____________________________ Will Bonnet, MD sdj:ea D: 06/27/2013 23:00:08 ET T: 06/27/2013 23:58:17 ET JOB#: [id number]  cc: Will Bonnet, MD, <Dictator> Will Bonnet MD ELECTRONICALLY SIGNED 06/28/2013 8:05

## 2014-10-03 NOTE — Consult Note (Signed)
Patient seen yesterday evening. I was unable to [facility] the [facility] system remotely to write the note. This is the first time I have been able to access the system to document the information. Most of her discomfort at present seems to be more pleuritic in nature. She has improved significantly with the IV hydration and antibiotic therapy. She is having minimal symptoms related to her stone at present. We can give her up to 2 weeks to allow for an attempt at spontaneous stone passage. Continuation of the Flomax is reasonable. She states that she has not passed any stones previously spontaneously. Every stone can be different.  If the stone has not passed within 2 weeks, surgical intervention would then be recommended. I would be very hesitant to utilize anesthesia at this point with the pneumonia and sepsis unless absolutely indicated. This has been discussed at length with Ms. Litaker. Follow-up on an outpatient basis from a GYN standpoint is also reasonable at this point. She should have a follow-up in the office in approximately one week with K[facility]. We will make a determination at that time whether to proceed with lithotripsy or ureteroscopic stone removal. If she develops any significant right-sided flank pain or other significant symptoms related to her stone, other intervention may be indicated at that point. Please feel free to contact us with any other questions.  Electronic Signatures: Smith Robertope, Zamirah Denny S (MD)  (Signed on 21-Jan-15 12:30)  Authored  Last Updated: 21-Jan-15 12:30 by Smith Robertope, Geoffery Aultman S (MD)

## 2014-10-03 NOTE — Consult Note (Signed)
Patient: Cassandra Graves  Date of birth: 07-03-1965  Date of consult: 06/27/2013  Reason for consult: Ureterolithiasis  History: Ms. Crotteau is a 49 year old left American female with a history of nephrolithiasis.  She underwent lithotripsy by Dr. Artis Flock in 2008.  She is also had ureteroscopic stone removal.  She has not been experiencing any flank pain or discomfort suggestive of stone passage.  She did not even know that she had a stone.  There is a 9 mm stone in the right kidney.  There are at least 2 small stones within the left kidney the largest being approximately 5 mm.  There is a 4 mm stone in the lower right ureter near the uterus with mild hydronephrosis.  There is no perinephric stranding.  Her urine is clear.  She just had a urine culture at [facility] 3 days prior which demonstrated only some mixed bacteria.  She has been experiencing bloody vaginal discharge for the past 2 months.  She was seen recently at [facility] emergency room.  She did undergo an endopelvic ultrasound.  This demonstrated no overall significant findings.  On today's CT scan, there is significant thickening of the uterus with some abnormal density changes.  She had gone a number of months without a menstrual cycle.  This bleeding that began.  She is likely perimenopausal.  Infection or malignancy must be considered in this situation.  She has had overall progressive worsening.  Her current acidosis has worsened since her evaluation at [facility] several days prior.  Most of her discomfort has been lower abdominal.  There does appear to be a possible pneumonia on CT scan.  This may be just 1 contributing factor.  Her symptoms, urine findings, and physical findings are not consistent with the stone as the source of her current issues.  There is a reasonable chance of spontaneous stone passage.  If she does have worsening fever with no other definitive source despite antibiotic therapy, stent placement may then be reasonable.  This has been  discussed with Ms. Hawkes.  She was given a Depo-Provera shot in the emergency room.  She had been instructed to follow-up with gynecology for further evaluation of the GYN issues.  Past medical history: Nephrolithiasis, dysfunctional uterine bleeding  Past surgical history: Cesarean section, ESWL, ureteroscopy, bilateral tubal ligation  Allergies: Penicillin  Medications on admission: Ortho Tri-Cyclen  Physical examination:  Temp: 100.1             Respirations: 20                Heart Rate: 119                   Blood Pressure: 124/61 Gen.: Alert but somewhat drowsy, oriented 3 HEENT: Within normal limits Chest: Clear to auscultation bilaterally.  Possible slight decreased breath sounds in the left lower region Cardiovascular: Regular rate and rhythm Abdomen: Soft, mild tenderness in the suprapubic region.  No palpable masses, no appreciable CVA tenderness Extremities: Free range of motion 4 Neurology: Motor and sensory grossly intact  Assessment: Right ureterolithiasis, mild hydronephrosis, acute renal insufficiency likely secondary to other health issues, dysfunctional uterine bleeding with possible infection versus malignancy, left lower lobe pneumonia  Recommendation: She was having no symptoms of stone passage.  She did not even know that she had a stone with her history of stones.  The dysfunctional bleeding and uterine issues are likely the source of her current problem.  The obstructing stone could be extruding factor to  her renal function.  With the clear urine and culture, I do not feel that stent placement is absolutely indicated at present.  There is a reasonable chance of spontaneous stone passage.  Recommend Flomax 0.4 mg daily.  Strain urine and save stone if passed.  If there is significant worsening of renal function despite IV hydration and antibiotic therapy, stent placement could then be considered.  Monitoring is otherwise all that is indicated.  I will forward her  information to weekends call.  GYN evaluation is recommended from the bleeding standpoint.  [facility].  She will likely need ESWL to the large right renal calculus.  She would likely benefit from a metabolic stone evaluation once she is recovered from these issues.  If there is no spontaneous passage within 2 weeks, ureteroscopic stone removal within be recommended.  Please feel free to contact us if there are any further questions.  Electronic Signatures: Smith Robertope, Melinda Gwinner S (MD)  (Signed on 16-Jan-15 14:52)  Authored  Last Updated: 16-Jan-15 14:52 by Smith Robertope, Delorus Langwell S (MD)

## 2014-10-03 NOTE — Consult Note (Signed)
PATIENT NAME:  Cassandra Graves, Cassandra Graves MR#:  [id number] DATE OF BIRTH:  [birth year]  DATE OF CONSULTATION:  06/30/2013  REFERRING PHYSICIAN:  Katharina Caperima Vaickute, MD CONSULTING PHYSICIAN:  Stann Mainlandavid P. Sampson GoonFitzgerald, MD  REASON FOR CONSULTATION: Bacteremia as well as pneumonia.   HISTORY OF PRESENT ILLNESS: This is a pleasant 49 year old female with a history of dysfunctional uterine bleeding, being worked up by GI. She also has a history of kidney stones status post [facility]. She was admitted on January 16 with complaints of uterine bleeding as well as progressive shortness of breath and cough and onset of fever, abdominal pain, nausea, vomiting, generalized weakness. She had recently undergone work-up for this uterine bleeding. On admission she was found to have a fever. She was also tachycardic, hypotensive. CT scan of the abdomen showed pneumonia in her left lower lobe. She also had an elevated lactic acid and elevated white count.   Since admission, the patient has been treated with antibiotics, and has clinically improved. Blood cultures are growing group A strep. Currently she has minimal cough, no chest pain. She is not having any shortness of breath. She is still quite a weak.   PAST MEDICAL HISTORY: 1. Dysfunctional uterine bleeding as above.  2.  Prior nephrolithiasis, status post lithotripsy and stenting.   PAST SURGICAL HISTORY: C-section and shock wave lithotripsy and stenting of her ureter.   SOCIAL HISTORY: Smoking, but has quit. No alcohol, drugs. She works at [employer]. She lives with her son. She has five children.   MEDICATIONS: On admission included progesterone. Antibiotics since admission: Include levofloxacin on January 17, vancomycin on January 16, 17 and 18, ertapenem on January 15, levofloxacin also on January 15. Other medications currently include: Tylenol, Zofran, Flomax, cough syrup Tussionex, morphine, IV vancomycin, levofloxacin 750 q. 48, magnesium oxide,  pantoprazole.   REVIEW OF SYSTEMS: Eleven systems reviewed and negative except as per history of present illness.   FAMILY HISTORY: Noncontributory.   PHYSICAL EXAMINATION: VITAL SIGNS: T-max over the last 24 hours 99.1, pulse is 70, blood pressure 126/69 and sat is 95% on room air.  GENERAL: She is pleasant, interactive, sitting in bed in no acute distress. She is obese.  HEENT: Pupils equal, round, reactive to light and accommodation. Extraocular movements are intact. Sclerae are anicteric. Her oropharynx is clear.  NECK: Supple.  HEART: Regular. She does have some decreased breath sounds bilateral bases. This this is greater on the left with some bronchial breath sounds.  ABDOMEN: Obese, soft, nontender. No rebound, guarding, she has no flank tenderness.  EXTREMITIES: No clubbing, cyanosis or edema.   LABORATORY, DIAGNOSTIC AND RADIOLOGIC DATA:  White blood count on admission was 28.7, it has come down to 13.8 yesterday, hemoglobin is 8.1. Platelets yesterday were 107. Differential on January 18, when the white count was 13.8 showed neutrophil 10.3, lymphocytes 2.7. Blood cultures x 2 January 16 grew group A strep. GC, chlamydia testing was negative. Urine clean catch was negative. Urinalysis on admission showed only four white cells. Pregnancy test was negative. LFTs showed elevation initially with an AST of 96, ALT 85, but that has resolved. T-bili was elevated at 3.0, but has come down to 0.6. Lipase was 51 on admission. Renal function shows a creatinine currently of 1.13.   Chest x-ray done on January 16, showed left lower lobe pneumonia.   CT of her abdomen and pelvis done on January 16, showed abnormal density in the left lower lobe consistent with pneumonia. There was a right  urethral stone as well, 4 mm in size and mild hydroureter nephrosis on the left.   Ultrasound of her uterus showed mildly enlarged uterus. There was normal stripe. The right ovary was normal. Left could not be  visualized.   The abdominal ultrasound done for elevated LFTs showed no evidence of acute cholecystitis. There was mild hydronephrosis.   IMPRESSION: A 49 year old admitted with clinical sepsis and bacteremia with group A strep. She has pneumonia in her left lower lung based on x-ray and CT.  She also has mild renal stone and mild hydronephrosis. Her white count has improved remarkably. She is afebrile and off of oxygen. Clinically, she is much improved.   RECOMMENDATIONS: 1.  I put her in for an HIV test.  2.  She is likely to be complete a 10 to 14 day course with antibiotics to cover at the group A strep. I suspect she may have had a viral illness and then developed group A strep pneumonia on top of it. She is currently on levofloxacin. I would continue that and then if she continues to improve, we could potentially discharge her on this. She is penicillin allergic so I would not recommend using amoxicillin or Augmentin.   Thank you for the consult. I will be glad to follow with you   ____________________________ Stann Mainland. Sampson Goon, MD dpf:cc D: 06/30/2013 16:38:41 ET T: 06/30/2013 18:43:24 ET JOB#: [id number]  cc: Stann Mainland. Sampson Goon, MD, <Dictator> [name] Sampson Goon MD ELECTRONICALLY SIGNED 07/02/2013 21:19

## 2014-10-03 NOTE — Consult Note (Signed)
Chief Complaint:  Subjective/Chief Complaint Patient says she feels better this morning.  No fevers or chills overnight.  Still complaining of cough.  She continues to have some nausea with emesis last night.  Patient states her pain is in her lower abdomen on both sides.  She denies dysuria or hematuria.  She continues to have vaginal bleeding.   Brief Assessment:  GEN no acute distress   Cardiac Regular   Respiratory normal resp effort   Gastrointestinal details normal Soft  No rebound tenderness  No gaurding  No rigidity  +tenderness to palpation in lower quadrant L>R   EXTR negative edema   Additional Physical Exam no CVA tenderness   Lab Results: Hepatic:  17-Jan-15 09:02   Bilirubin, Total  1.2  Alkaline Phosphatase 107 (45-117 NOTE: New Reference Range 05/02/13)  SGPT (ALT) 49  SGOT (AST) 30  Total Protein, Serum  5.8  Albumin, Serum  2.0  Bilirubin, Direct  0.60 (Result(s) reported on 28 Jun 2013 at 09:37AM.)  18-Jan-15 05:09   Bilirubin, Total 0.6  Alkaline Phosphatase 84 (45-117 NOTE: New Reference Range 05/02/13)  SGPT (ALT) 33  SGOT (AST) 23  Total Protein, Serum  5.5  Albumin, Serum  1.7  Routine Chem:  17-Jan-15 05:44   Glucose, Serum  146  BUN 13  Creatinine (comp) 1.13  Sodium, Serum 139  Potassium, Serum  3.1  Chloride, Serum  110  CO2, Serum 23  Calcium (Total), Serum  7.7  Osmolality (calc) 280  eGFR (African American) >60  eGFR (Non-African American)  57 (eGFR values <78m/min/1.73 m2 may be an indication of chronic kidney disease (CKD). Calculated eGFR is useful in patients with stable renal function. The eGFR calculation will not be reliable in acutely ill patients when serum creatinine is changing rapidly. It is not useful in  patients on dialysis. The eGFR calculation may not be applicable to patients at the low and high extremes of body sizes, pregnant women, and vegetarians.)  Anion Gap  6  18-Jan-15 05:09   Glucose, Serum  130   BUN 11  Creatinine (comp) 1.13  Sodium, Serum 138  Potassium, Serum  3.3  Chloride, Serum  113  CO2, Serum  20  Calcium (Total), Serum  7.6  Osmolality (calc) 277  eGFR (African American) >60  eGFR (Non-African American)  57 (eGFR values <668mmin/1.73 m2 may be an indication of chronic kidney disease (CKD). Calculated eGFR is useful in patients with stable renal function. The eGFR calculation will not be reliable in acutely ill patients when serum creatinine is changing rapidly. It is not useful in  patients on dialysis. The eGFR calculation may not be applicable to patients at the low and high extremes of body sizes, pregnant women, and vegetarians.)  Anion Gap  5  Routine Hem:  17-Jan-15 05:44   WBC (CBC)  17.6  RBC (CBC)  2.66  Hemoglobin (CBC)  7.5  Hematocrit (CBC)  22.1  Platelet Count (CBC)  119  MCV 83  MCH 28.3  MCHC 34.1  RDW 12.9  Neutrophil % 84.4  Lymphocyte % 9.3  Monocyte % 6.1  Eosinophil % 0.0  Basophil % 0.2  Neutrophil #  14.8  Lymphocyte # 1.6  Monocyte #  1.1  Eosinophil # 0.0  Basophil # 0.0 (Result(s) reported on 28 Jun 2013 at 06:53AM.)  18-Jan-15 05:09   WBC (CBC)  13.8  RBC (CBC)  2.38  Hemoglobin (CBC)  6.9  Hematocrit (CBC)  20.0  Platelet Count (CBC)  107  MCV 84  MCH 28.8  MCHC 34.3  RDW 13.0  Neutrophil % 74.9  Lymphocyte % 19.3  Monocyte % 5.2  Eosinophil % 0.3  Basophil % 0.3  Neutrophil #  10.3  Lymphocyte # 2.7  Monocyte # 0.7  Eosinophil # 0.0  Basophil # 0.0 (Result(s) reported on 29 Jun 2013 at 05:54AM.)   Radiology Results: XRay:    17-Jan-15 11:45, Chest PA and Lateral  Chest PA and Lateral   REASON FOR EXAM:    pneumonia?  COMMENTS:       PROCEDURE: DXR - DXR CHEST PA (OR AP) AND LATERAL  - Jun 28 2013 11:45AM     CLINICAL DATA:  Possible pneumonia    EXAM:  CHEST  2 VIEW    COMPARISON:  None.    FINDINGS:  Heart size and vascular pattern are normal. Right lung is clear.  There is infiltrate  posteriorly in the medial left lower lobe.   IMPRESSION:  Left lower lobe pneumonia. Radiographic followup after appropriate  therapy recommended.      Electronically Signed    By: Skipper Cliche M.D.    On: 06/28/2013 11:59         Verified By: Rachael Fee, M.D.,  [place]:    17-Jan-15 13:51, US Abdomen Limited Survey  US Abdomen Limited Survey   REASON FOR EXAM:    sepsis, elevated t. bili, transminases, ?   cholecystitis  COMMENTS:   Body Site: Gallbladder, Liver, Common Bile Duct    PROCEDURE: [place]  - US ABDOMEN LIMITED SURVEY  - Jun 28 2013  1:51PM     CLINICAL DATA:  Clinical sepsis, elevated hepatic function studies,  possible acute cholecystitis.    EXAM:  US ABDOMEN LIMITED - RIGHT UPPER QUADRANT    COMPARISON:  None.    FINDINGS:  Gallbladder    The gallbladder is adequately distended with no evidence of stones,  wall thickening, or pericholecystic fluid. There is no positive  sonographic Murphy's sign.    Common bile duct    Diameter: Normal at 3.8 mm    Liver:    The liver exhibits no focal mass nor ductal dilation. Portal venous  flow appears normal in direction toward the liver. No ascites is  demonstrated in the right upper quadrant of the abdomen.  Limited views of the right kidney reveal mild hydronephrosis.     IMPRESSION:  There is no evidence of acute cholecystitis nor other acute  gallbladder abnormality. The common bile duct as well as the liver  exhibit no acute abnormalities either. Mild hydronephrosis is  present.      Electronically Signed    By: David  Martinique    On: 06/28/2013 13:52         Verified By: DAVID A. [place], M.D., MD   Assessment/Plan:  Invasive Device Daily Assessment of Necessity:  Does the patient currently have any of the following indwelling devices? none   Assessment/Plan:  Assessment 49 yo female admitted for abdominal pain, cough, weakness and tachycardia. CT scan demonstrated 55m right distal  ureteral stone as well as possible left lower lobe pneumonia and enlarged uterus with questionable necrotic/inflammatory area.   Patient afebrile overnight.  Continues to complain of cough.  Patient also with positive blood cultures 2/2 with gram positive cocci/beta strep.  1. Left lower lobe pna- Chest xray confirms pna.  Continue abx and manage per primary team.  Patient complaining of cough for last several days  2.  Abnormal vaginal bleeding- gyn consulted and recommend outpatient f/u for fibroid and malignancy work up.  Unlikely source of infection per consult note  3. Elevated creatinine- improved to 1.13 with hydration.   4. Elevated WBC- 28-->17-->13.8, gram positive cocci in chains in blood. Discussed with microbiology-most likely beta strep. Await speciation.  Continue abx.  Consult ID. Urinalysis and urine culture negative   5. Right distal ureteral stone- mild hydronephrosis, creatinine normal and no CVA tenderness on my exam. Bacteremia with gram positive cocci/beta strep unlikely urinary source. Physical exam and symptoms not c/w renal colic.  Continue flomax  Assessment: 49 yo female with complicated picture of infection with bacteremia and evidence of left lower lobe pneumonia. Awaiting speciation of beta strep in blood cultures.    Recommend: Discussed with Dr. Ether Griffins.  Patient stable and improving overnight.  Would continue to treat for pneumonia and bacteremia.  Await speciation.  Unlikely urinary source so no urgency for ureteral stent placement.  Will continue to follow and inform urologist on call Monday.  Patient will need treatment for stone if she does not pass it on her own. And if patient worsens will need to consider right ureteral stent.  Discussed with patient.   Electronic Signatures: Margy Clarks (MD)  (Signed 18-Jan-15 09:54)  Authored: Chief Complaint, Brief Assessment, Lab Results, Radiology Results, Assessment/Plan   Last Updated: 18-Jan-15 09:54 by  Margy Clarks (MD)

## 2014-10-11 NOTE — Op Note (Signed)
Patient: This 49 year old F had a surgical procedure performed on 29-Sep-2014.  Post Operative Report:  Pre-Op Diagnosis right ureteral stone   Post-Op Diagnosis Same   Operation cystoscopy, right retrograde pyelogram with interpretation, right ureteral stent placement   Anesthesia General   Specimen Type None   Findings large filling defect in the mid-ureter consistent with patient's known stone with proximal hydronephrosis.   Surgeon Berniece SalinesBenjamin Kirstine Jacquin   EBL: Minimal   Complications None   Description of Procedure: Patient was consented prior to being brought back to the operating room. SHe was then brought back to the operating room placed on the table in supine position. General anesthesia was then induced and endotracheal tube inserted. This then placed in dorsolithotomy position and prepped and draped in the routine sterile fashion. A timeout was held.  Using a 22.5 [place] cystoscope with a 30 ?? lens, I gently passed the scope into the patient's urethra and into the bladder under visual guidance. A 360?? cystoscopic evaluation was performed with no mucosal abnormalities, no tumors, and no foreign bodies identified. I then passed a 0. 038 Sensor wire into the right ureteral orifice and into the right renal pelvis. I then slid a 5 [place] open-ended ureteral catheter over the wire and into the renal pelvis and removed the wire.   Next I injected 10 cc of contrast into the renal collecting system and performed a retrograde pyelogram. I then replaced the wire through the open-ended ureteral catheter and remove the catheter. I then slid a 24 cm x 6 French double-J ureteral stent over the wire and into the renal pelvis under fluoroscopic guidance. Once a nice curl was noted in the renal pelvis I advanced the distal end of the stent into the bladder before removing the wire completely.   A final fluoroscopic image was obtained confirming the curl in the renal pelvis as well as a curl in the  bladder.   Electronic Signatures: Crist FatHerrick, Deberah Adolf W (MD)  (Signed 19-Apr-16 20:11)  Authored: Patient and Date/Time, Operative Note   Last Updated: 19-Apr-16 20:11 by Crist FatHerrick, Katiya Fike W (MD)

## 2014-10-11 NOTE — Consult Note (Signed)
Chief Complaint:  Subjective/Chief Complaint complaining of dysuria and occatinal flank pain, improved since prior to stent.   VITAL SIGNS/ANCILLARY NOTES: **Vital Signs.:   20-Apr-16 16:35  Vital Signs Type Q 8hr  Temperature Temperature (F) 98.4  Celsius 36.8  Temperature Source oral  Pulse Pulse 75  Respirations Respirations 18  Systolic BP Systolic BP 160  Diastolic BP (mmHg) Diastolic BP (mmHg) 89  Mean BP 112  Pulse Ox % Pulse Ox % 99  Pulse Ox Activity Level  At rest  Oxygen Delivery Room Air/ 21 %  *Intake and Output.:   Daily 20-Apr-16 07:00  Grand Totals Intake:  2859 Output:  1600    Net:  1259 24 Hr.:  1259  Oral Intake      In:  240  IV (Primary)      In:  2619  IV (Secondary)      In:  0  Urine ml     Out:  1600  Length of Stay Totals Intake:  3734 Output:  1600    Net:  2134   Brief Assessment:  GEN well developed, well nourished, no acute distress   Cardiac -- LE edema   Respiratory no use of accessory muscles   Gastrointestinal Normal   Gastrointestinal details normal Soft  Nontender  Nondistended  No masses palpable  Bowel sounds normal  No CVA tenderness   EXTR negative cyanosis/clubbing, negative edema   Lab Results: Routine Hem:  20-Apr-16 04:32   WBC (CBC) 9.0  RBC (CBC) 4.43  Hemoglobin (CBC) 12.6  Hematocrit (CBC) 37.4  Platelet Count (CBC) 235  MCV 85  MCH 28.4  MCHC 33.6  RDW 12.7  Neutrophil % 42.1  Lymphocyte % 48.0  Monocyte % 5.3  Eosinophil % 4.3  Basophil % 0.3  Neutrophil # 3.8  Lymphocyte #  4.3  Monocyte # 0.5  Eosinophil # 0.4  Basophil # 0.0 (Result(s) reported on 30 Sep 2014 at [facility]06:05AM.)   Assessment/Plan:  Assessment/Plan:  Assessment 49 yo F POD 1 s/p right ureteral stent, doing well, no fevers, WBC normalized.  Urine culture negative.   Plan -d/c home per primary team -discharge meds as per consult note -[facility]  Please page Dr. Apolinar JunesBrandon with any questions  or concerns.  Urology to sign off.   Electronic Signatures: Claris GladdenBrandon, Sparkles Mcneely J (MD)  (Signed 20-Apr-16 16:48)  Authored: Chief Complaint, VITAL SIGNS/ANCILLARY NOTES, Brief Assessment, Lab Results, Assessment/Plan   Last Updated: 20-Apr-16 16:48 by Claris GladdenBrandon, Garfield Coiner J (MD)

## 2014-10-11 NOTE — Discharge Summary (Addendum)
PATIENT NAME:  Cassandra Graves, Cassandra Graves MR#:  [id number] DATE OF BIRTH:  Jul 10, 1965  DATE OF ADMISSION:  09/28/2014 DATE OF DISCHARGE:  10/01/2014  DISCHARGE DIAGNOSIS:  Right flank pain and hematuria secondary to  right ureteral stone. The patient had a right ureteral obstruction due to a kidney  9 mm stone, status post ureteral stent placement.   DISCHARGE MEDICATIONS:  Advil 200 mg every 6 hours as needed for pain, Flomax 0.4 mg at bedtime, oxybutynin 5 mg t.i.d., Levaquin 5 mg p.o. 24 hours for 5 days.   CONSULTATIONS: Urology consult with Dr. Vanna ScotlandAshley Brandon.   HOSPITAL COURSE: This patient is a 49 year old female patient who came in because of right flank pain, nausea, vomiting, and hematuria. The patient noted to have 9 mm ureteral stone with severe hydronephrosis. The patient noted to have elevated WBC of 13 on admission. She was admitted to hospitalist service for right flank pain secondary to right ureteral stone. The patient had a previous history of kidney stones and had a shock lithotripsy x2. She had [facility] 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  She  was to medical service, started on fluids, pain medication, and also IV antibiotics with Levaquin. The patient was seen by urology, Dr. Vanna ScotlandAshley Brandon.  She was kept n.p.o. and Dr. Marlou PorchHerrick saw the patient and the patient was taken to OR on April 19.  She had a large stone in the mid ureter consistent with proximal hydronephrosis and the patient had ureteral stents placed in the right ureter. The patient tolerated the procedure well. The patient did not have any fever after the procedure.  Urine cultures were negative and white count normalized to 9 and she has normal pain in the right flank area and she will be going home with Flomax and also ditropan.   Advised to continue  p.o. hydration and see Dr. Apolinar JunesBrandon in a week.   DISCHARGE VITAL SIGNS: Temperature 98.1, heart rate 67, blood pressure  is 149/83, saturations 98% on room air.    PERTINENT LABORATORY DATA:  Electrolytes: Sodium is 139, potassium 3.8, chloride 107, bicarbonate 23, BUN 16, creatinine 1.14, glucose 116.  Initial white count 13.7, hemoglobin 14.4, hematocrit 43.1, platelets 294. The patient's CT abdomen shows bilateral nephrolithiasis and 9 mm UPJ calculus with severe right hydronephrosis.  The patient's repeat white count 9 and repeat kidney function BUN 14, creatinine 1.12 on April 19.   PHYSICAL EXAMINATION TODAY:   CARDIOVASCULAR: S1, S2 regular.  LUNGS: Clear to auscultation.  ABDOMEN: Soft, nontender, nondistended. No right flank tenderness.   TIME SPENT: More than 30 minutes.     ____________________________ Katha HammingSnehalatha Rufus Beske, MD sk:tr D: 10/01/2014 10:02:46 ET T: 10/01/2014 12:32:06 ET JOB#: [id number]  cc: Katha HammingSnehalatha Citlally Captain, MD, <Dictator> Claris GladdenAshley J. Brandon, MD Katha HammingSNEHALATHA Ottavio Norem MD ELECTRONICALLY SIGNED 10/13/2014 21:19

## 2014-10-11 NOTE — H&P (Signed)
PATIENT NAME:  Cassandra MelterMAPP, Hasana E MR#:  [id number] DATE OF BIRTH:  22-Jan-1966  DATE OF ADMISSION:  09/28/2014  PRIMARY CARE PHYSICIAN: Nonlocal.  REFERRING PHYSICIAN: Kathreen DevoidKevin A. Paduchowski, MD  CHIEF COMPLAINT: Hematuria and right flank pain.  HISTORY OF PRESENT ILLNESS: A 49 year old female with a history of a kidney stone presented to the ED with the above chief complaint. The patient is alert, awake, oriented, in no acute distress. The patient has had right flank pain with hematuria for the past 2 days. In addition, the patient has nausea, vomiting, but she denies any fever or chills. No headache or dizziness. No diarrhea, melena, or bloody stool. Denies any dysuria or incontinence.   PAST MEDICAL HISTORY: Sepsis with Streptococcus pyogenes bacteria; pneumonia; dysfunctional uterine bleeding; postoperative hemorrhage; acute anemia status post PRBC transfusion; mild renal failure, resolved; right ureteral stone with right hydronephrosis; nephrolithiasis.   SOCIAL HISTORY: Continues to smoke about 2 to 3 cigarettes a day for many years. Denies any alcohol drinking or illicit drugs.   PAST SURGICAL HISTORY: C-section; shockwave lithotripsy, ureteral stone removal, and stent [facility].   FAMILY HISTORY: Hypertension and diabetes.   ALLERGIES: PENICILLIN.    HOME MEDICATIONS: Aleve 200 mg p.o. 2 tablets every 6 hours p.r.n.  REVIEW OF SYSTEMS:  CONSTITUTIONAL: The patient denies any fever or chills. No headache or dizziness or weakness.  EYES: No double vision or blurred vision.  EARS, NOSE, THROAT: No postnasal drip, slurred speech, or dysphagia.  CARDIOVASCULAR: No chest pain, palpitation, orthopnea, or nocturnal dyspnea. No leg edema.  PULMONARY: No cough, sputum, shortness of breath, or hematemesis. GASTROINTESTINAL: Positive for abdominal pain, nausea, vomiting. No diarrhea, no melena or bloody stool.  GENITOURINARY: No dysuria but has hematuria. No incontinence but has right  flank pain.  NEUROLOGIC: No syncope, loss of consciousness, or seizure.  ENDOCRINOLOGY: No polyuria, polydipsia, heat or cold intolerance.  HEMATOLOGY: No easy bruising or bleeding except hematuria.   PHYSICAL EXAMINATION:  VITAL SIGNS: Temperature 97.5, blood pressure 150/75, pulse 78, oxygen saturation 96% on room air.  GENERAL: This patient is alert, awake, oriented, in no acute distress.  HEENT: Pupils round, equal and reactive to light and accommodation. Moist oral mucosa. Clear oropharynx.  NECK: Supple. No JVD or carotid bruit. No lymphadenopathy. No thyromegaly.  CARDIOVASCULAR: S1 and S2. Regular rate and rhythm. No murmurs or gallops.  PULMONARY: Bilateral air entry. No wheezing or rales. No use of accessory muscles to breathe.  ABDOMEN: Soft, obese. Bowel sounds present. No distention, but has tenderness on the right side with right CVA tenderness. No organomegaly.  EXTREMITIES: No edema, clubbing or cyanosis. No calf tenderness. Bilateral pedal pulses present. SKIN: No rash or jaundice.  NEUROLOGY: A and O x 3. No focal deficit. Power 5/5. Sensation intact.   LABORATORY DATA: 1.  CAT scan of the abdomen and pelvis showed 9 mm right UPJ calculus associated with secondary finding of right ureteral obstruction, bilateral nephrolithiasis. 2.  Urinalysis is negative.  3.  WBC 13.7, hemoglobin 14.4, platelet at 294,000. Glucose 116, BUN 16, creatinine 1.14. Electrolytes normal.   IMPRESSIONS:  1.  Bilateral nephrolithiasis.  2.  Right ureteral obstruction by kidney stone.  3.  Hematuria due to nephrolithiasis.  4.  Leukocytosis, possibly due to reaction. 5.  Tobacco abuse.  6.  Obesity.   PLAN OF TREATMENT:  The patient will be admitted to medical floor. I will start intravenous fluid support and pain control with Zofran p.r.n. According to Emergency Department physician,  on-call urologist will place a stent.   I discussed the patient's condition and the plan of treatment with  the patient and the patient's husband.   TIME SPENT: About 47 minutes. For tobacco abuse, the patient was counseled for smoking cessation for 3 minutes. We will give a nicotine patch.    ____________________________ Shaune Pollack, MD qc:ST D: 09/28/2014 21:17:03 ET T: 09/28/2014 22:05:21 ET JOB#: [id number]  cc: Shaune Pollack, MD, <Dictator> Shaune Pollack MD ELECTRONICALLY SIGNED 09/30/2014 17:39

## 2014-10-11 NOTE — Consult Note (Signed)
Admit Diagnosis:   URETER OBSTRUCTION CONDITION GUARDED: Onset Date: 29-Sep-2014, Status: Active, Description: URETER OBSTRUCTION CONDITION GUARDED      Chronic Problem:   Renal colic (756.4): Status: Active, Coding System: ICD9, Coded Name: Renal colic    kidney stones:   Home Medications: Medication Instructions Status  Advil 200 mg oral tablet 2 tab(s) orally every 6 hours, As Needed - for Pain Active   Lab Results:  Hepatic:  18-Apr-16 16:25   Bilirubin, Total  0.2 (0.3-1.2 NOTE: New Reference Range  08/18/14)  Alkaline Phosphatase 103 (38-126 NOTE: New Reference Range  08/18/14)  SGPT (ALT) 23 (14-54 NOTE: New Reference Range  08/18/14)  SGOT (AST) 23 (15-41 NOTE: New Reference Range  08/18/14)  Total Protein, Serum 8.1 (6.5-8.1 NOTE: New Reference Range  08/18/14)  Albumin, Serum 4.4 (3.5-5.0 NOTE: New reference range  08/18/14)  Routine Chem:  18-Apr-16 16:25   Glucose, Serum  116 (65-99 NOTE: New Reference Range  08/18/14)  BUN 16 (6-20 NOTE: New Reference Range  08/18/14)  Creatinine (comp)  1.14 (0.44-1.00 NOTE: New Reference Range  08/18/14)  Sodium, Serum 139 (135-145 NOTE: New Reference Range  08/18/14)  Potassium, Serum 3.8 (3.5-5.1 NOTE: New Reference Range  08/18/14)  Chloride, Serum 107 (101-111 NOTE: New Reference Range  08/18/14)  CO2, Serum 23 (22-32 NOTE: New Reference Range  08/18/14)  Calcium (Total), Serum 9.5 (8.9-10.3 NOTE: New Reference Range  08/18/14)  Anion Gap 9  eGFR (African American) >60  eGFR (Non-African American)  56 (eGFR values <79m/min/1.73 m2 may be an indication of chronic kidney disease (CKD). Calculated eGFR is useful in patients with stable renal function. The eGFR calculation will not be reliable in acutely ill patients when serum creatinine is changing rapidly. It is not useful in patients on dialysis. The eGFR calculation may not be applicable to patients at the low and high extremes of body  sizes, pregnant women, and vegetarians.)  Routine UA:  18-Apr-16 16:25   Color (UA) Yellow  Clarity (UA) Hazy  Glucose (UA) Negative  Bilirubin (UA) Negative  Ketones (UA) Negative  Specific Gravity (UA) 1.015  Blood (UA) Negative  pH (UA) 7.0  Protein (UA) Negative  Nitrite (UA) Negative  Leukocyte Esterase (UA) Negative (Result(s) reported on 28 Sep 2014 at 06:51PM.)  RBC (UA) 0-5  WBC (UA) 0-5  Bacteria (UA) NONE SEEN  Epithelial Cells (UA) 0-5  Mucous (UA) PRESENT (Result(s) reported on 28 Sep 2014 at 06:51PM.)  Routine Hem:  18-Apr-16 16:25   WBC (CBC)  13.7  RBC (CBC) 5.12  Hemoglobin (CBC) 14.4  Hematocrit (CBC) 43.1  Platelet Count (CBC) 294  MCV 84  MCH 28.1  MCHC 33.5  RDW 12.6  Neutrophil % 48.7  Lymphocyte % 41.3  Monocyte % 5.5  Eosinophil % 3.7  Basophil % 0.8  Neutrophil #  6.7  Lymphocyte #  5.7  Monocyte # 0.8  Eosinophil # 0.5  Basophil # 0.1 (Result(s) reported on 28 Sep 2014 at [facility])   Radiology Results:  Radiology Results: CT:    18-Apr-16 18:42, CT Abdomen Pelvis WO for Stone  CT Abdomen Pelvis WO for Stone  REASON FOR EXAM:    Rt flank pain  COMMENTS:       PROCEDURE: CT  - CT ABDOMEN /PELVIS WO (STONE)  - Sep 28 2014  6:42PM     CLINICAL DATA:  Right flank pain for 2 days    EXAM:  CT ABDOMEN AND PELVIS WITHOUT  CONTRAST    TECHNIQUE:  Multidetector CT imaging of the abdomen and pelvis was performed  following the standard protocol without IV contrast.    COMPARISON:  06/27/2013  FINDINGS:  9 mm right UPJ calculus is associated with severe right  hydronephrosis. Multiple small calculi in both kidneys compatible  with bilateral nephrolithiasis.    Liver, gallbladder, spleen, pancreas, adrenal glands are within  normal limits    Normal appendix.  Sigmoid diverticulosis.    Bladder, uterus, and adnexa are within normal limits    No free-fluid.  No vertebral compression.     IMPRESSION:  9 mm right UPJ calculus is  associated with secondary findings of  right ureteral obstruction.    Bilateral nephrolithiasis.      Electronically Signed    By: Marybelle Killings M.D.    On: 09/28/2014 19:17         Verified By: Jamas Lav, M.D.,    Penicillin: Hives  Nursing Flowsheets: **Vital Signs.:   19-Apr-16 08:35  Vital Signs Type Q 8hr  Temperature Temperature (F) 98.1  Celsius 36.7  Temperature Source oral  Pulse Pulse 75  Respirations Respirations 18  Systolic BP Systolic BP 751  Diastolic BP (mmHg) Diastolic BP (mmHg) 73  Mean BP 92  Pulse Ox % Pulse Ox % 97  Pulse Ox Activity Level  At rest  Oxygen Delivery Room Air/ 21 %  *Intake and Output.:   Daily 19-Apr-16 07:00  Grand Totals Intake:  875 Output:  0    Net:  700 24 Hr.:  174  IV (Primary)      In:  875  Urine ml     Out:  0  Length of Stay Totals Intake:  875 Output:      Net:  944    General Aspect 49 yo F who presented overnight to the ED with severe right flank pain, nausea and vomiting.  She reports a week long history of right back pain but developed worseing pain with associated nausea and vomiting yesterday afternoon.  She also reports subjective fevers and chills but has not taken her temperative.    In the ED, she was found to have a 9 mm obstructing ureteral stone with severe hydronephrosis.  She has a midly elevated WBC 13, Cr 1.1, UA negative.  No documented fevers or tachycardia.    She does report a history of kidney stones s/p multiple stone surgeries including failed ESWL x 2 and URS, stent.  Her last stone surgery was 2 years ago at [facility].  She also reports a history of sepsis and ICU admission at [place].  She was told her stones are "hereditary".  An extensive 12 point reivew of systems was performed and is negative other than as per HPI.   Case History and Physical Exam:  Chief Complaint see HPI   Past Medical Health Dysfunction uterine bleeding, h/o kidney stons, h/o pneumonia   Past  Surgical History C-section, ESWL x multiple, ureteroscopy/ stent, tubal ligation   Family History Smoking  + family history of kidney stones.  No drugs or EtOH.   HEENT NCAT, trachea midline   Neck/Nodes Supple  No Adenopathy   Chest/Lungs Clear   Cardiovascular Other  No LE clubbing, cyanosis, edema   Abdomen soft, NT, ND.  + right CVA tenderness with mild tinderness in right lower quadrant.   Musculoskeletal Full range of motion   Neurological Grossly WNL   Skin Warm  Dry  No rashes  Impression 49 yo F with R 9 mm UPJ stone, severe hydronephrosis secondary to obstructing stone.  No concern for sepsis.  Given the size of the stone, she is unlikely to pass without surgical intervention.  Recommend cystoscopy, stent placement with possible ureteroscopy/ laser lithotripsy if possible pending laser availibilty.  She is aggreeable with this plan.  Risks/ benefits of the procedure were discussed in detail.   Plan 1) please remain NPO for add on case as above 2) consent order and preop abx on call to OR 3) Dr. Louis Meckel to perform surgery this evening 4) pain meds/ IV hydration 5) Patient may be discharged later this evening or in AM with Urology outpatient follow up, please discharge home with pain medications, flomax and ditropan 5 mg po tid prn bladder spasms  Please page Urology with any questions or concerns   Electronic Signatures: Sherlynn Stalls (MD)  (Signed 19-Apr-16 12:05)  Authored: Health Issues, Significant Events - History, Home Medications, Labs, Radiology Results, Allergies, Vital Signs, General Aspect/Present Illness, History and Physical Exam, Impression/Plan   Last Updated: 19-Apr-16 12:05 by Sherlynn Stalls (MD)

## 2014-10-13 ENCOUNTER — Other Ambulatory Visit: Payer: Self-pay | Admitting: Urology

## 2014-10-13 ENCOUNTER — Ambulatory Visit
Admission: RE | Admit: 2014-10-13 | Discharge: 2014-10-13 | Disposition: A | Discharge status: Home / Self Care | Payer: Self-pay | Source: Ambulatory Visit | Attending: Urology | Admitting: Urology

## 2014-10-13 DIAGNOSIS — N200 Calculus of kidney: Secondary | ICD-10-CM

## 2015-12-12 IMAGING — CR DG ABDOMEN 1V
1 series · 1 of 1 positions shown · non-contrast
Comparison: 07/08/2013, prior CT 09/28/2014

CLINICAL DATA: 48-year-old female with a history of kidney stones.
Hematuria. Previous stenting 1 week prior.

EXAM:
ABDOMEN - 1 VIEW

[dg abd 1 view]
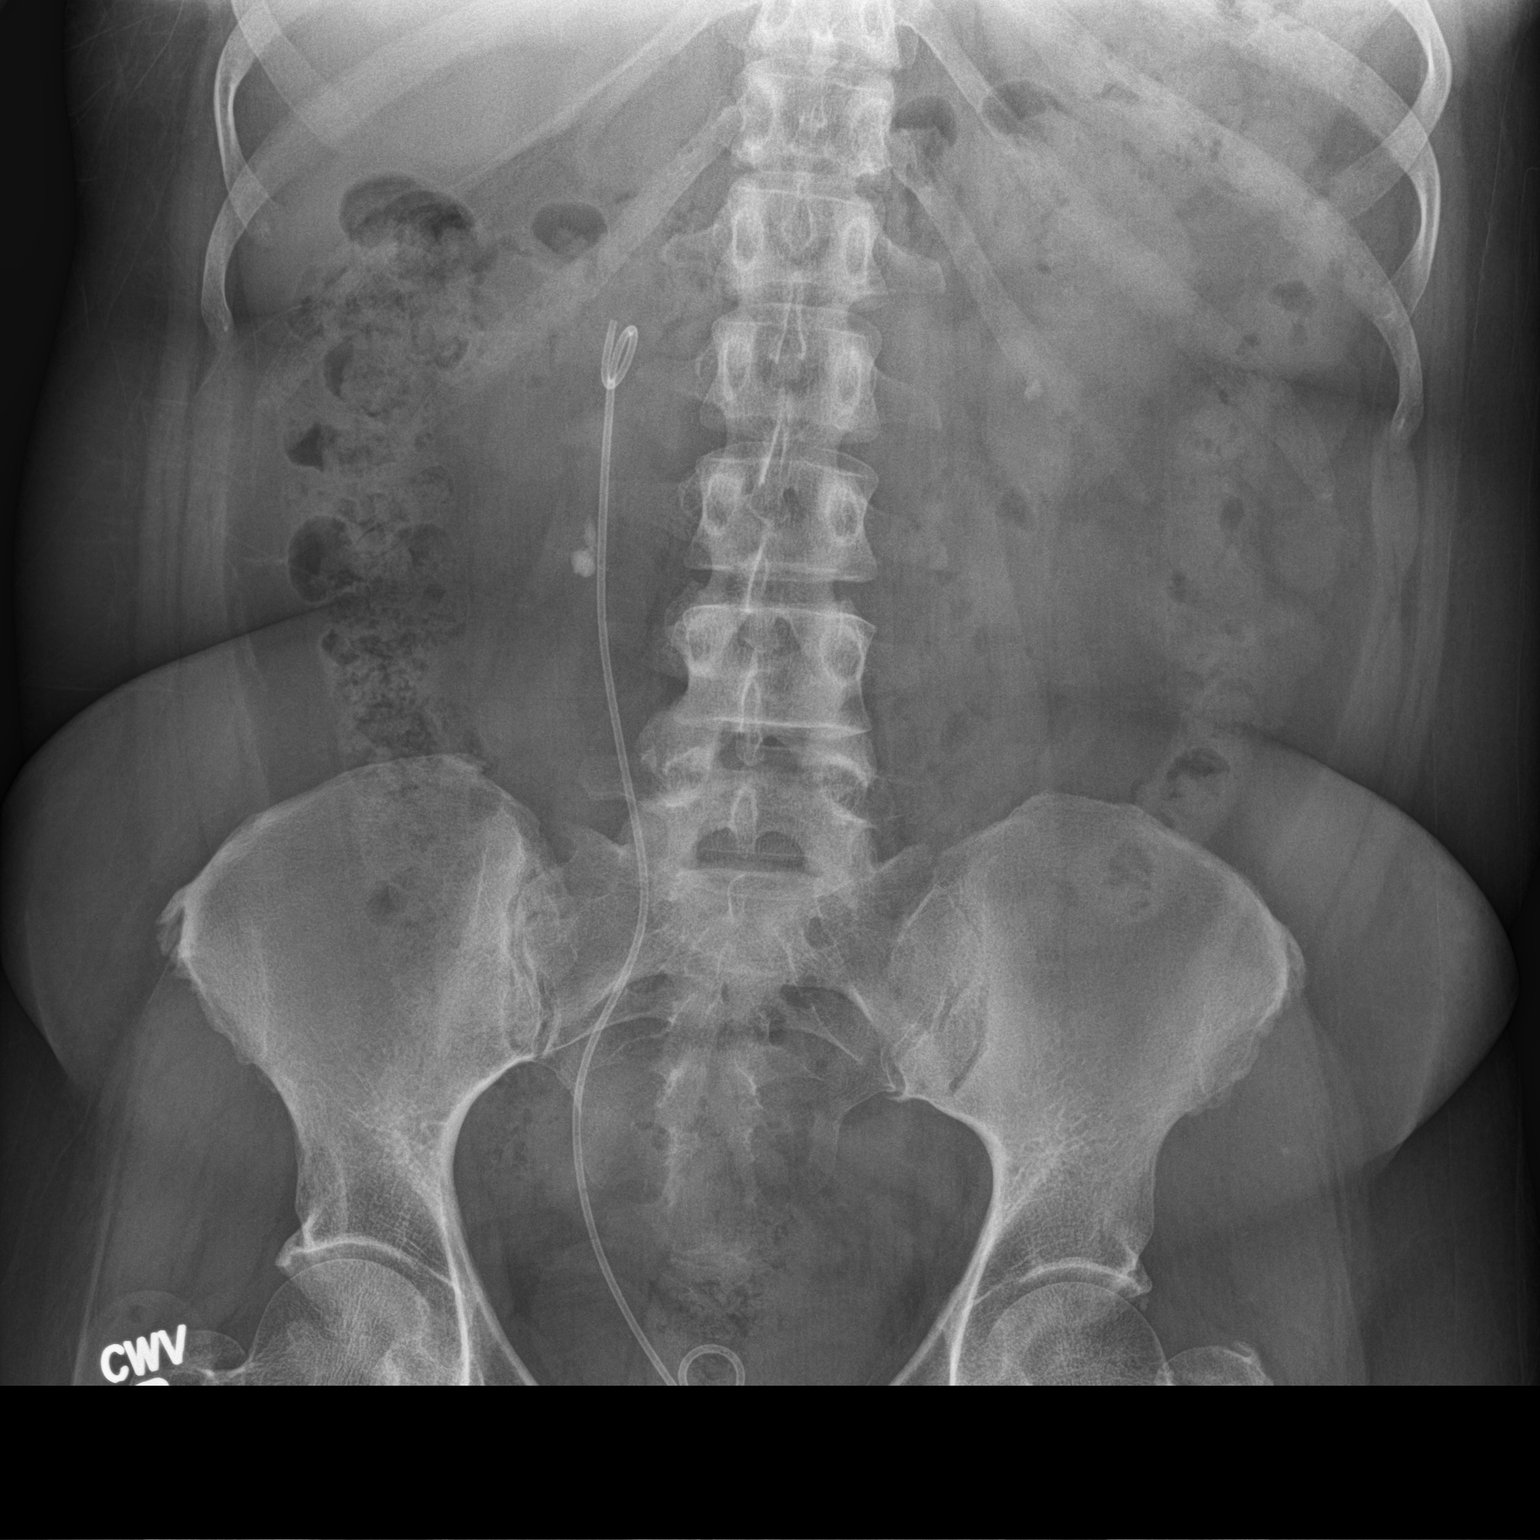

[1 of 1 positions shown; findings below may reference images not displayed]

FINDINGS: Single from view demonstrates right double-J ureteral stent with the
proximal loop formed in the region of the collecting system and the
distal loop formed in the region of the bladder. Unremarkable course
of the stent.

Rounded density adjacent to the stent, proximal third measures 7 mm.
An adjacent linear density measures 10 mm.

The previous rounded density overlying the right renal silhouette no
longer evident above the right twelfth rib.

Unremarkable appearance of the bowel gas pattern.

No displaced fracture.  Mild degenerative changes of the hips.
IMPRESSION: Compared to the prior plain film there has been interval placement
of a double-J right-sided ureteral stent, appropriately position.

Previous renal stone on the right is no longer evident, with
calcified densities adjacent to the proximal third of the stent,
compatible with stone fragments within the ureter. Location is
similar to that of the prior CT.

Unchanged density overlying the inferior pole of the left kidney.

## 2017-07-07 ENCOUNTER — Encounter: Payer: Self-pay | Admitting: Emergency Medicine

## 2017-07-07 ENCOUNTER — Other Ambulatory Visit: Payer: Self-pay

## 2017-07-07 ENCOUNTER — Emergency Department: Payer: Worker's Compensation

## 2017-07-07 ENCOUNTER — Emergency Department
Admission: EM | Admit: 2017-07-07 | Discharge: 2017-07-07 | Disposition: A | Discharge status: Home / Self Care | Payer: Worker's Compensation | Attending: Emergency Medicine | Admitting: Emergency Medicine

## 2017-07-07 DIAGNOSIS — M25551 Pain in right hip: Secondary | ICD-10-CM | POA: Diagnosis not present

## 2017-07-07 DIAGNOSIS — X500XXA Overexertion from strenuous movement or load, initial encounter: Secondary | ICD-10-CM | POA: Insufficient documentation

## 2017-07-07 DIAGNOSIS — Y998 Other external cause status: Secondary | ICD-10-CM | POA: Insufficient documentation

## 2017-07-07 DIAGNOSIS — Y929 Unspecified place or not applicable: Secondary | ICD-10-CM | POA: Insufficient documentation

## 2017-07-07 DIAGNOSIS — S39012A Strain of muscle, fascia and tendon of lower back, initial encounter: Secondary | ICD-10-CM | POA: Diagnosis not present

## 2017-07-07 DIAGNOSIS — S3992XA Unspecified injury of lower back, initial encounter: Secondary | ICD-10-CM | POA: Diagnosis present

## 2017-07-07 DIAGNOSIS — F17200 Nicotine dependence, unspecified, uncomplicated: Secondary | ICD-10-CM | POA: Insufficient documentation

## 2017-07-07 DIAGNOSIS — Y9389 Activity, other specified: Secondary | ICD-10-CM | POA: Insufficient documentation

## 2017-07-07 MED ORDER — MELOXICAM 15 MG PO TABS: 15.0000 mg | ORAL_TABLET | Freq: Every day | ORAL | 0 refills | Status: DC

## 2017-07-07 MED ORDER — CYCLOBENZAPRINE HCL 5 MG PO TABS: 5.0000 mg | ORAL_TABLET | Freq: Three times a day (TID) | ORAL | 0 refills | Status: DC | PRN

## 2017-07-07 MED ORDER — KETOROLAC TROMETHAMINE 30 MG/ML IJ SOLN
30.0000 mg | Freq: Once | INTRAMUSCULAR | Status: AC
  Administered 2017-07-07: 30 mg via INTRAMUSCULAR
  Filled 2017-07-07: qty 1

## 2017-07-07 NOTE — Discharge Instructions (Signed)
Please take Mobic daily with food.  You may use Flexeril as needed for muscle tightness, spasms of the lower back.  He may return to work with light duty restrictions.  Follow-up with orthopedics if no improvement 1 week.  Return to the emergency department for any worsening symptoms or urgent changes in health.

## 2017-07-07 NOTE — ED Provider Notes (Signed)
[facility] EMERGENCY DEPARTMENT Provider Note   CSN: [id number] Arrival date & time: 07/07/17  1936     History   Chief Complaint Chief Complaint  Patient presents with  . Back Pain    HPI Cassandra Graves is a 52 y.o. female presents to the emergency department for evaluation of acute lower back pain.  Patient states she was at work Tuesday or Wednesday of this week with she was lifting a patient and noticed a pulling sensation in her right lower back.  Patient's pain is currently 6 out of 10.  She denies feeling or hearing a pop.  Her pain is located on the right buttocks and right lower back.  She also has some lateral hip pain.  She denies any numbness tingling or radicular symptoms.  Pain is increased with standing and walking.  She is been taking Tylenol and ibuprofen with minimal relief.  HPI  History reviewed. No pertinent past medical history.  There are no active problems to display for this patient.   History reviewed. No pertinent surgical history.  OB History    No data available       Home Medications    Prior to Admission medications   Medication Sig Start Date End Date Taking? Authorizing Provider  cyclobenzaprine (FLEXERIL) 5 MG tablet Take 1-2 tablets (5-10 mg total) by mouth 3 (three) times daily as needed for muscle spasms. 07/07/17   Evon SlackGaines, Dinesha Twiggs C, PA-C  meloxicam (MOBIC) 15 MG tablet Take 1 tablet (15 mg total) by mouth daily. 07/07/17   Evon SlackGaines, Selicia Windom C, PA-C    Family History History reviewed. No pertinent family history.  Social History Social History   Tobacco Use  . Smoking status: Current Some Day Smoker  . Smokeless tobacco: Never Used  Substance Use Topics  . Alcohol use: No    Frequency: Never  . Drug use: No     Allergies   Penicillins   Review of Systems Review of Systems  Constitutional: Negative for fever.  Respiratory: Negative for shortness of breath.   Cardiovascular: Negative for chest pain.    Gastrointestinal: Negative for abdominal pain.  Genitourinary: Negative for difficulty urinating, dysuria and urgency.  Musculoskeletal: Positive for back pain and myalgias. Negative for gait problem.  Skin: Negative for rash.  Neurological: Negative for dizziness and headaches.     Physical Exam Updated Vital Signs BP (!) 175/93 (BP Location: Left Arm)   Pulse 84   Temp 98.9 F (37.2 C) (Oral)   Resp 16   Ht 5\' 2"  (1.575 m)   Wt 73.5 kg (162 lb)   SpO2 97%   BMI 29.63 kg/m   Physical Exam  Constitutional: She is oriented to person, place, and time. She appears well-developed and well-nourished.  HENT:  Head: Normocephalic and atraumatic.  Eyes: Conjunctivae are normal.  Neck: Normal range of motion.  Cardiovascular: Normal rate.  Pulmonary/Chest: Effort normal. No respiratory distress.  Musculoskeletal:  Lumbar Spine: Examination of the lumbar spine reveals no bony abnormality, no edema, and no ecchymosis.  There is no step off.  The patient has full range of motion of the lumbar spine with flexion and extension.  The patient has normal lateral bend and rotation.  Mild pain with lumbar range of motion.  Patient is tender along the right paravertebral muscles of the lumbar spine with no muscle spasms noted.  No spinous process tenderness.  The patient is non tender along the iliac crest.  The patient is  non tender in the sciatic notch.  The patient is non tender along the Sacroiliac joint.  There is no Coccyx joint tenderness.    Bilateral Lower Extremities: Examination of the lower extremities reveals no bony abnormality, no edema, and no ecchymosis.  The patient has full active and passive range of motion of the hips, knees, and ankles.  Mild discomfort of the right hip with internal rotation.  The patient is non tender along the greater trochanter region.  The patient has a negative Denna Haggard' test bilaterally.  There is normal skin warmth.  There is normal capillary refill  bilaterally.    Neurologic: The patient has a negative straight leg raise.  The patient has normal muscle strength testing for the quadriceps, calves, ankle dorsiflexion, ankle plantarflexion, and extensor hallicus longus.  The patient has sensation that is intact to light touch.     Neurological: She is alert and oriented to person, place, and time.  Skin: Skin is warm. No rash noted.  Psychiatric: She has a normal mood and affect. Her behavior is normal. Thought content normal.     ED Treatments / Results  Labs (all labs ordered are listed, but only abnormal results are displayed) Labs Reviewed - No data to display  EKG  EKG Interpretation None       Radiology Dg Lumbar Spine Complete  Result Date: 07/07/2017 CLINICAL DATA:  Right low back pain and lateral hip pain EXAM: LUMBAR SPINE - COMPLETE 4+ VIEW COMPARISON:  10/13/2014 FINDINGS: Lumbar alignment within normal limits. Mild degenerative changes at L4-L5 and L5-S1. Vertebral body heights are normal. Possible punctate stones over the upper pole of the right kidney. Multiple stones over the left kidney, measuring 6 mm superior and 7 mm inferior. IMPRESSION: 1. Mild degenerative changes.  No acute osseous abnormality. 2. Multiple left kidney stones. Possible faint kidney stones on the right. Electronically Signed   By: Jasmine Pang M.D.   On: 07/07/2017 22:12   Dg Hip Unilat With Pelvis 2-3 Views Right  Result Date: 07/07/2017 CLINICAL DATA:  Right hip pain. EXAM: DG HIP (WITH OR WITHOUT PELVIS) 2-3V RIGHT COMPARISON:  None. FINDINGS: There is no evidence of hip fracture or dislocation. No degenerative joint narrowing or hip spurring. Enthesophytes at the bilateral anterior iliac crest, incidental. IMPRESSION: Negative. Electronically Signed   By: Marnee Spring M.D.   On: 07/07/2017 21:56   Imaging: AP lateral and 2 oblique views of the lumbar spine are ordered and reviewed by me today.  Impression: Patient has no evidence of  acute compression deformity.  There is good alignment of the vertebral bodies with no significant spinal listhesis.  Mild degenerative changes present.  Imaging: AP pelvis lateral right hip views are ordered and reviewed by me today.  Impression: Patient has no significant hip arthritis throughout the right hip joint.  No signs of AVN.  No evidence of fracture or dislocation.  Procedures Procedures (including critical care time)  Medications Ordered in ED Medications  ketorolac (TORADOL) 30 MG/ML injection 30 mg (30 mg Intramuscular Given 07/07/17 2131)     Initial Impression / Assessment and Plan / ED Course  I have reviewed the triage vital signs and the nursing notes.  Pertinent labs & imaging results that were available during my care of the patient were reviewed by me and considered in my medical decision making (see chart for details).     52 year old female with workers comp injury to the right lower back.  She states of the  22nd or 04 July 2017 she was at work assisting to lift a patient with she felt a pull in her right lower back.  She said tightness and pain since.  Today the emergency department she was given Toradol which decreased her pain from a 6 to a 2 out of 10.  She has no signs of radicular symptoms.  Mild pain with right hip internal/external rotation but no significant arthropathy-year-old plain film x-rays.  Patient is given a prescription for Mobic, Flexeril and she will follow-up with orthopedics in 1 week if no improvement.  She is educated on signs and symptoms return to the ED for.  Final Clinical Impressions(s) / ED Diagnoses   Final diagnoses:  Hip pain, right  Strain of lumbar region, initial encounter    ED Discharge Orders        Ordered    meloxicam (MOBIC) 15 MG tablet  Daily     07/07/17 2221    cyclobenzaprine (FLEXERIL) 5 MG tablet  3 times daily PRN     07/07/17 2221       Ronnette Juniper 07/07/17 2230    Jene Every,  MD 07/07/17 2309

## 2017-07-07 NOTE — ED Notes (Addendum)
Pt advised this tech that she does want to file [facility] however she is unsure if drug screening is required and gives permission for her employer to be contacted at [phone]. Supervisor on duty at this time is Langley AdieGarnetta McAdams and she states "no drug screening is needing due to patient not being seen within 24 hours on injury"

## 2017-07-07 NOTE — ED Triage Notes (Signed)
Pt states she was at work on Wednesday with a patient sitting on her right leg. Pt states she now has pain in right lower back extending down posterior right leg. Pt is unsure if she wants to file worker's compensation. Pt is ambulatory without difficulty. Pt took advil at 1200.

## 2017-07-27 ENCOUNTER — Emergency Department: Payer: Self-pay

## 2017-07-27 ENCOUNTER — Emergency Department
Admission: EM | Admit: 2017-07-27 | Discharge: 2017-07-28 | Disposition: A | Discharge status: Home / Self Care | Payer: Self-pay | Attending: Emergency Medicine | Admitting: Emergency Medicine

## 2017-07-27 ENCOUNTER — Other Ambulatory Visit: Payer: Self-pay

## 2017-07-27 ENCOUNTER — Encounter: Payer: Self-pay | Admitting: Emergency Medicine

## 2017-07-27 DIAGNOSIS — J189 Pneumonia, unspecified organism: Secondary | ICD-10-CM

## 2017-07-27 DIAGNOSIS — F17210 Nicotine dependence, cigarettes, uncomplicated: Secondary | ICD-10-CM | POA: Insufficient documentation

## 2017-07-27 DIAGNOSIS — R05 Cough: Secondary | ICD-10-CM | POA: Insufficient documentation

## 2017-07-27 DIAGNOSIS — Z79899 Other long term (current) drug therapy: Secondary | ICD-10-CM | POA: Insufficient documentation

## 2017-07-27 DIAGNOSIS — R079 Chest pain, unspecified: Secondary | ICD-10-CM

## 2017-07-27 DIAGNOSIS — R112 Nausea with vomiting, unspecified: Secondary | ICD-10-CM | POA: Insufficient documentation

## 2017-07-27 DIAGNOSIS — J181 Lobar pneumonia, unspecified organism: Secondary | ICD-10-CM | POA: Insufficient documentation

## 2017-07-27 HISTORY — DX: Calculus of kidney: N20.0

## 2017-07-27 LAB — BASIC METABOLIC PANEL
Anion gap: 9 (ref 5–15)
BUN: 10 mg/dL (ref 6–20)
CHLORIDE: 109 mmol/L (ref 101–111)
CO2: 22 mmol/L (ref 22–32)
Calcium: 9 mg/dL (ref 8.9–10.3)
Creatinine, Ser: 1.01 mg/dL — ABNORMAL HIGH (ref 0.44–1.00)
GFR calc Af Amer: >60 mL/min (ref >60)
GFR calc non Af Amer: >60 mL/min (ref >60)
Glucose, Bld: 108 mg/dL — ABNORMAL HIGH (ref 65–99)
Potassium: 4 mmol/L (ref 3.5–5.1)
SODIUM: 140 mmol/L (ref 135–145)

## 2017-07-27 LAB — CBC
HEMATOCRIT: 40.1 % (ref 35.0–47.0)
Hemoglobin: 13.6 g/dL (ref 12.0–16.0)
MCH: 27.9 pg (ref 26.0–34.0)
MCHC: 33.9 g/dL (ref 32.0–36.0)
MCV: 82.4 fL (ref 80.0–100.0)
Platelets: 222 10*3/uL (ref 150–440)
RBC: 4.86 MIL/uL (ref 3.80–5.20)
RDW: 12.9 % (ref 11.5–14.5)
WBC: 7.8 10*3/uL (ref 3.6–11.0)

## 2017-07-27 LAB — TROPONIN I: Troponin I: <0.03 ng/mL (ref <0.03)

## 2017-07-27 MED ORDER — SODIUM CHLORIDE 0.9 % IV SOLN
500.0000 mg | Freq: Once | INTRAVENOUS | Status: AC
  Administered 2017-07-27: 500 mg via INTRAVENOUS
  Filled 2017-07-27: qty 500

## 2017-07-27 MED ORDER — ONDANSETRON HCL 4 MG/2ML IJ SOLN
4.0000 mg | Freq: Once | INTRAMUSCULAR | Status: AC
Start: 2017-07-27 — End: 2017-07-27
  Administered 2017-07-27: 4 mg via INTRAVENOUS
  Filled 2017-07-27: qty 2

## 2017-07-27 MED ORDER — SODIUM CHLORIDE 0.9 % IV BOLUS (SEPSIS)
1000.0000 mL | Freq: Once | INTRAVENOUS | Status: AC
  Administered 2017-07-27: 1000 mL via INTRAVENOUS

## 2017-07-27 NOTE — ED Triage Notes (Signed)
Pt states began to have chest pain, bilateral chest last pm while at work. Pt states she then began vomiting. Pt states she does have a cough. Pt states "when I cough I throw up blood". Pt denies diarrhea. Pt is masked in triage.

## 2017-07-27 NOTE — ED Provider Notes (Signed)
[facility] Emergency Department Provider Note   ____________________________________________   First MD Initiated Contact with Patient 07/27/17 2315     (approximate)  I have reviewed the triage vital signs and the nursing notes.   HISTORY  Chief Complaint Chest Pain and Emesis    HPI Cassandra Graves is a 52 y.o. female who comes into the hospital today with some chest pain and vomiting.  The patient states that her symptoms started yesterday.  She states that she has pain across her chest but is just a little bit of tightness.  The patient rates her pain a 5 out of 10 in intensity.  The patient states it was likely just from coughing.  She started coughing yesterday.  She coughed up or vomited up some blood.  She reports that she was coughing a lot and and seemed to vomit some emesis with some streaks of blood in it.  The patient states that she vomited 3 times today at work.  She denies any sick contacts or fevers.  The patient has not been taking any medications for his symptoms.  She has had some body aches and chills.  She did not get a flu shot this year.  The patient is here today for evaluation of her symptoms.   Past Medical History:  Diagnosis Date  . Kidney stones     There are no active problems to display for this patient.   Past Surgical History:  Procedure Laterality Date  . kidney stones      Prior to Admission medications   Medication Sig Start Date End Date Taking? Authorizing Provider  azithromycin (ZITHROMAX Z-PAK) 250 MG tablet Take 2 tablets (500 mg) on  Day 1,  followed by 1 tablet (250 mg) once daily on Days 2 through 5. 07/28/17 08/02/17  Rebecka Apley, MD  cyclobenzaprine (FLEXERIL) 5 MG tablet Take 1-2 tablets (5-10 mg total) by mouth 3 (three) times daily as needed for muscle spasms. 07/07/17   Evon Slack, PA-C  meloxicam (MOBIC) 15 MG tablet Take 1 tablet (15 mg total) by mouth daily. 07/07/17   Evon Slack, PA-C   ondansetron (ZOFRAN ODT) 4 MG disintegrating tablet Take 1 tablet (4 mg total) by mouth every 8 (eight) hours as needed for nausea or vomiting. 07/28/17   Rebecka Apley, MD    Allergies Penicillins  No family history on file.  Social History Social History   Tobacco Use  . Smoking status: Current Some Day Smoker  . Smokeless tobacco: Never Used  Substance Use Topics  . Alcohol use: No    Frequency: Never  . Drug use: No    Review of Systems  Constitutional: No fever/chills Eyes: No visual changes. ENT: No sore throat. Cardiovascular:  chest pain. Respiratory: cough and shortness of breath. Gastrointestinal: Nausea and vomiting with No abdominal pain.  No diarrhea.  No constipation. Genitourinary: Negative for dysuria. Musculoskeletal: Negative for back pain. Skin: Negative for rash. Neurological: Negative for headaches, focal weakness or numbness.   ____________________________________________   PHYSICAL EXAM:  VITAL SIGNS: ED Triage Vitals [07/27/17 1937]  Enc Vitals Group     BP (!) 149/65     Pulse Rate 89     Resp 18     Temp 98.6 F (37 C)     Temp Source Oral     SpO2 98 %     Weight 160 lb (72.6 kg)     Height 5\' 2"  (1.575 m)  Head Circumference      Peak Flow      Pain Score 8     Pain Loc      Pain Edu?      Excl. in GC?     Constitutional: Alert and oriented. Well appearing and in mild distress. Eyes: Conjunctivae are normal. PERRL. EOMI. Head: Atraumatic. Nose: No congestion/rhinnorhea. Mouth/Throat: Mucous membranes are moist.  Oropharynx non-erythematous. Cardiovascular: Normal rate, regular rhythm. Grossly normal heart sounds.  Good peripheral circulation. Respiratory: Normal respiratory effort.  No retractions. Lungs CTAB. Gastrointestinal: Soft and nontender. No distention.  Positive bowel sounds Musculoskeletal: No lower extremity tenderness nor edema.   Neurologic:  Normal speech and language.  Skin:  Skin is warm, dry  and intact.  Psychiatric: Mood and affect are normal.   ____________________________________________   LABS (all labs ordered are listed, but only abnormal results are displayed)  Labs Reviewed  BASIC METABOLIC PANEL - Abnormal; Notable for the following components:      Result Value   Glucose, Bld 108 (*)    Creatinine, Ser 1.01 (*)    All other components within normal limits  CULTURE, BLOOD (ROUTINE X 2)  CULTURE, BLOOD (ROUTINE X 2)  CBC  TROPONIN I  HEPATIC FUNCTION PANEL  LIPASE, BLOOD  INFLUENZA PANEL BY PCR (TYPE A & B)  TROPONIN I   ____________________________________________  EKG  ED ECG REPORT I, Rebecka ApleyWebster,  Kesean Serviss P, the attending physician, personally viewed and interpreted this ECG.   Date: 07/27/2017  EKG Time: 1944  Rate: 87  Rhythm: normal sinus rhythm  Axis: normal  Intervals:none  ST&T Change: none  ____________________________________________  RADIOLOGY  ED MD interpretation:  CXR: Right middle lobe infiltrate  Official radiology report(s): Dg Chest 2 View  Result Date: 07/27/2017 CLINICAL DATA:  Bilateral chest pain beginning last night. Vomiting and cough. EXAM: CHEST  2 VIEW COMPARISON:  06/09/2014 FINDINGS: The cardiomediastinal silhouette is within normal limits. There is new peribronchial thickening and mild patchy right middle lobe opacity. No pleural effusion or pneumothorax is identified. No acute osseous abnormality is seen. IMPRESSION: Bronchitic changes with atelectasis or early pneumonia in the right middle lobe. Electronically Signed   By: Sebastian AcheAllen  Grady M.D.   On: 07/27/2017 20:12    ____________________________________________   PROCEDURES  Procedure(s) performed: None  Procedures  Critical Care performed: No  ____________________________________________   INITIAL IMPRESSION / ASSESSMENT AND PLAN / ED COURSE  As part of my medical decision making, I reviewed the following data within the electronic MEDICAL RECORD NUMBER  Notes from prior ED visits and [place] Controlled Substance Database   This is a 52 year old female who comes into the hospital today with some cough and chest pain as well as some nausea and vomiting.  My differential diagnosis includes influenza, bronchitis, pneumonia.  We did check some blood work on the patient as well as a chest x-ray.  The patient's blood work is unremarkable.  The patient's chest x-ray shows a right middle lobe infiltrate.  She does feel warm to touch although her temperature was normal.  I will give the patient a dose of ibuprofen as well as a liter of normal saline and some Zofran.  She will receive some azithromycin for her infiltrate and a repeat troponin as the first was unremarkable.  I will also check flu since the patient has had the body aches chills in the cough with vomiting.  The patient will be reassessed.     Since pneumonia came back negative.  Her hepatic function panel and lipase were also negative.  The patient did receive a dose of azithromycin IV.  She will be discharged home with some medicine for nausea as well as azithromycin.  The patient should return with any worsening condition or any other concerns. ____________________________________________   FINAL CLINICAL IMPRESSION(S) / ED DIAGNOSES  Final diagnoses:  Community acquired pneumonia of right middle lobe of lung (HCC)  Non-intractable vomiting with nausea, unspecified vomiting type  Chest pain, unspecified type     ED Discharge Orders        Ordered    azithromycin (ZITHROMAX Z-PAK) 250 MG tablet     07/28/17 0117    ondansetron (ZOFRAN ODT) 4 MG disintegrating tablet  Every 8 hours PRN     07/28/17 0117       Note:  This document was prepared using Dragon voice recognition software and may include unintentional dictation errors.    Rebecka Apley, MD 07/28/17 [phone]

## 2017-07-28 LAB — HEPATIC FUNCTION PANEL
ALBUMIN: 3.7 g/dL (ref 3.5–5.0)
ALT: 15 U/L (ref 14–54)
AST: 15 U/L (ref 15–41)
Alkaline Phosphatase: 90 U/L (ref 38–126)
BILIRUBIN DIRECT: 0.1 mg/dL (ref 0.1–0.5)
Indirect Bilirubin: 0.8 mg/dL (ref 0.3–0.9)
Total Bilirubin: 0.9 mg/dL (ref 0.3–1.2)
Total Protein: 7.1 g/dL (ref 6.5–8.1)

## 2017-07-28 LAB — TROPONIN I: Troponin I: <0.03 ng/mL (ref <0.03)

## 2017-07-28 LAB — LIPASE, BLOOD: LIPASE: 23 U/L (ref 11–51)

## 2017-07-28 LAB — INFLUENZA PANEL BY PCR (TYPE A & B)
INFLAPCR: NEGATIVE
Influenza B By PCR: NEGATIVE

## 2017-07-28 MED ORDER — ONDANSETRON 4 MG PO TBDP: 4.0000 mg | ORAL_TABLET | Freq: Three times a day (TID) | ORAL | 0 refills | Status: DC | PRN

## 2017-07-28 MED ORDER — AZITHROMYCIN 250 MG PO TABS: ORAL_TABLET | ORAL | 0 refills | Status: AC

## 2017-07-28 NOTE — Discharge Instructions (Signed)
Please follow up with your primary care physician for further evaluation of your chest pain and pneumonia. Please return with any other worsened condition or other concerns

## 2017-08-02 LAB — CULTURE, BLOOD (ROUTINE X 2)
Culture: NO GROWTH
Culture: NO GROWTH
Special Requests: ADEQUATE
Special Requests: ADEQUATE

## 2017-11-15 ENCOUNTER — Other Ambulatory Visit (HOSPITAL_COMMUNITY): Payer: Self-pay | Admitting: Family Medicine

## 2017-11-15 ENCOUNTER — Ambulatory Visit
Admission: RE | Admit: 2017-11-15 | Discharge: 2017-11-15 | Disposition: A | Discharge status: Home / Self Care | Payer: Self-pay | Source: Ambulatory Visit | Attending: Vascular Surgery | Admitting: Vascular Surgery

## 2017-11-15 ENCOUNTER — Ambulatory Visit
Admission: RE | Admit: 2017-11-15 | Discharge: 2017-11-15 | Disposition: A | Discharge status: Home / Self Care | Payer: Self-pay | Source: Ambulatory Visit | Attending: Family Medicine | Admitting: Family Medicine

## 2017-11-15 DIAGNOSIS — R7611 Nonspecific reaction to tuberculin skin test without active tuberculosis: Secondary | ICD-10-CM

## 2018-09-25 IMAGING — CR DG CHEST 2V
2 series · 2 of 2 positions shown · non-contrast
Comparison: 06/09/2014

CLINICAL DATA: Bilateral chest pain beginning last night. Vomiting
and cough.

EXAM:
CHEST  2 VIEW

[chest pa]
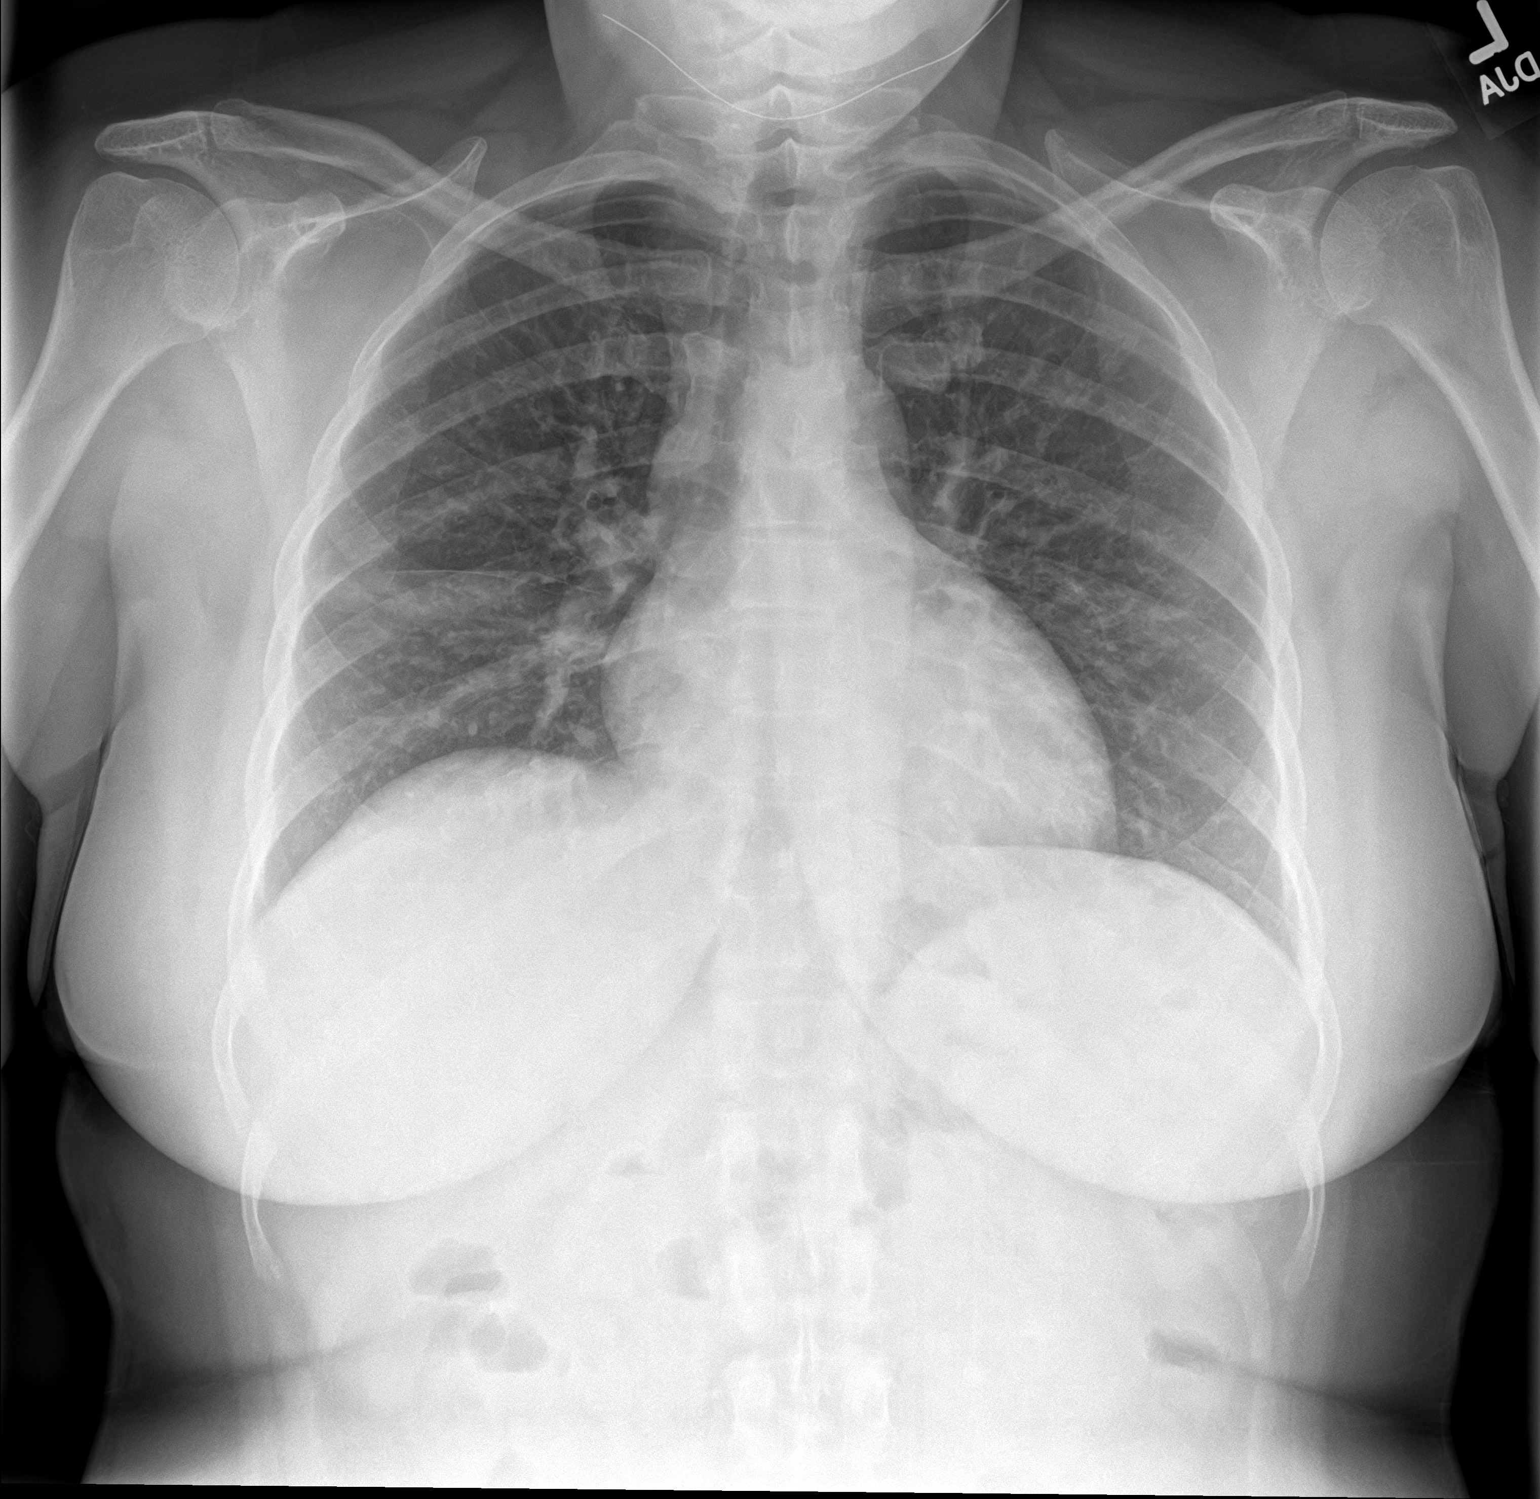

[chest lat]
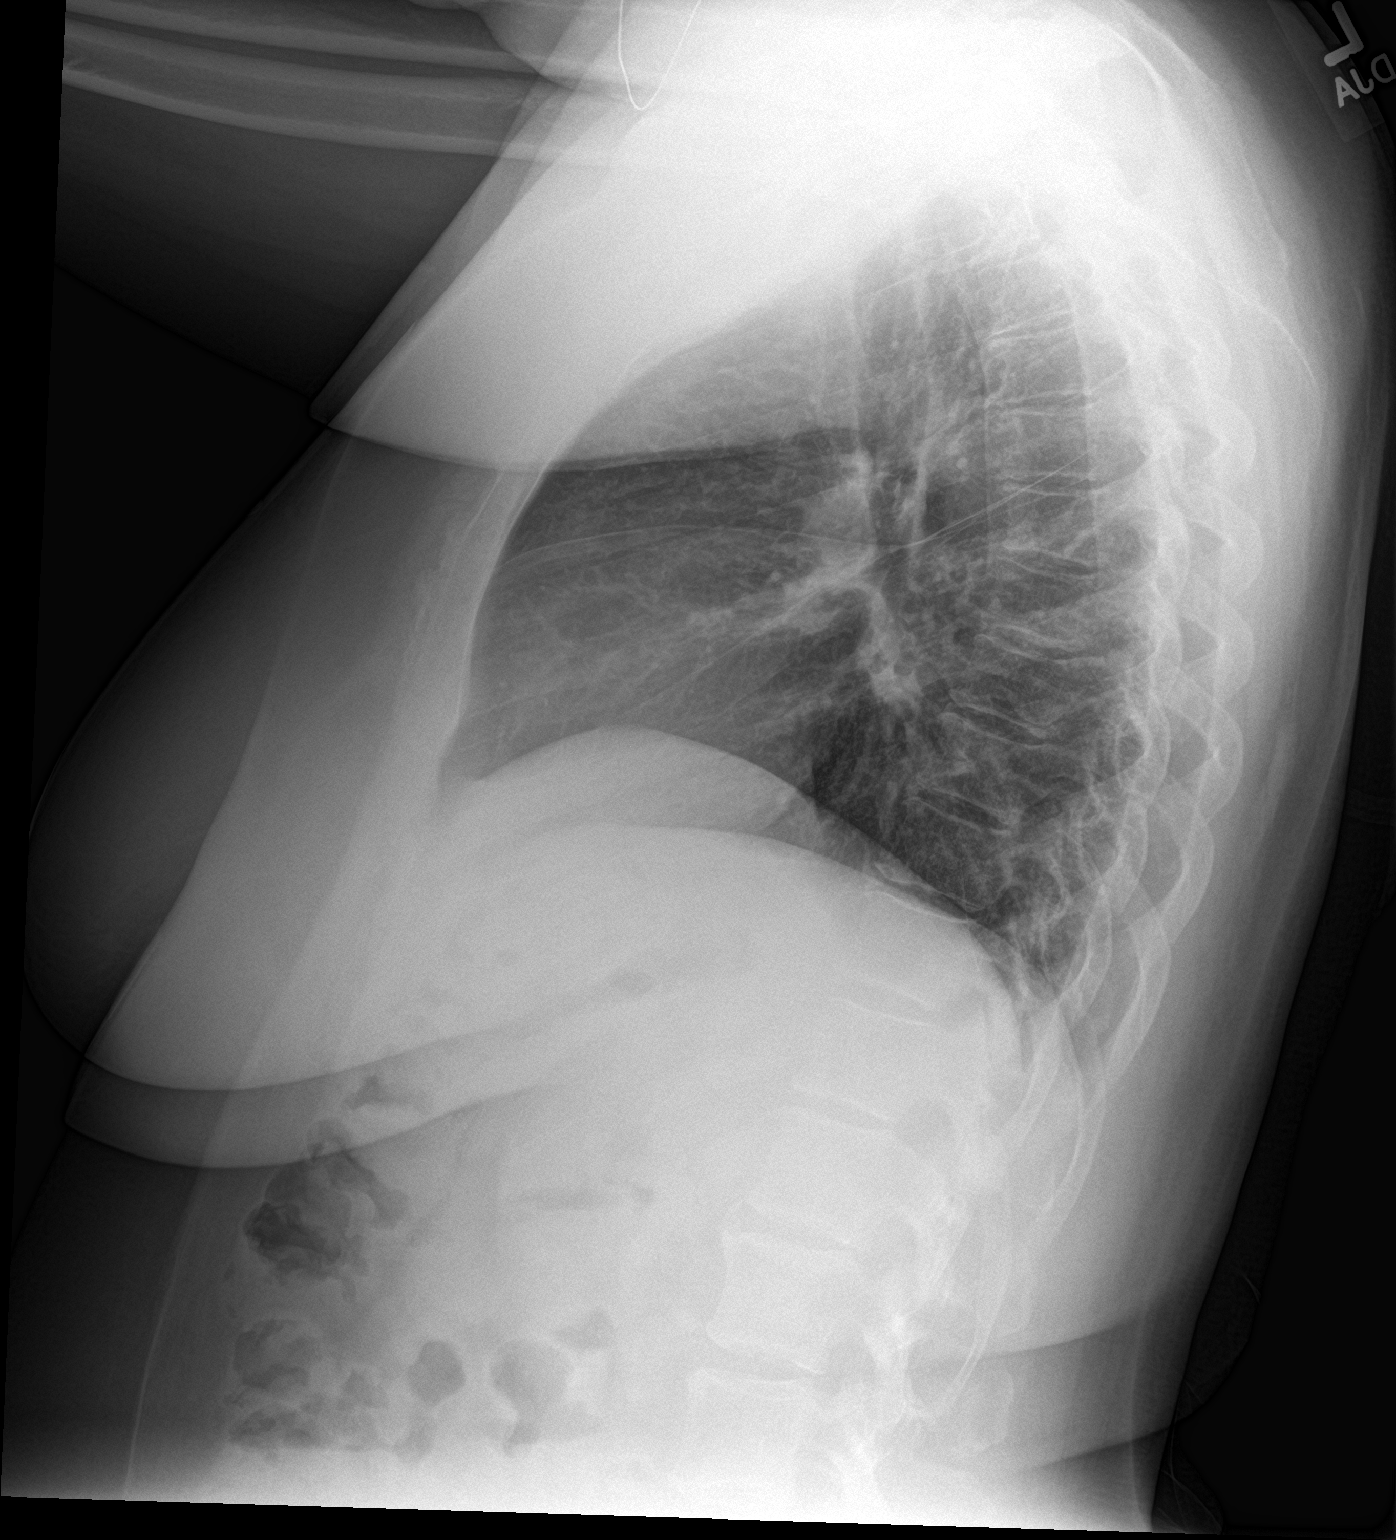

[2 of 2 positions shown; findings below may reference images not displayed]

FINDINGS: The cardiomediastinal silhouette is within normal limits. There is
new peribronchial thickening and mild patchy right middle lobe
opacity. No pleural effusion or pneumothorax is identified. No acute
osseous abnormality is seen.
IMPRESSION: Bronchitic changes with atelectasis or early pneumonia in the right
middle lobe.

## 2018-12-09 ENCOUNTER — Other Ambulatory Visit: Payer: Self-pay

## 2018-12-09 ENCOUNTER — Emergency Department
Admission: EM | Admit: 2018-12-09 | Discharge: 2018-12-09 | Disposition: A | Discharge status: Home / Self Care | Payer: Self-pay | Attending: Emergency Medicine | Admitting: Emergency Medicine

## 2018-12-09 ENCOUNTER — Encounter: Payer: Self-pay | Admitting: Emergency Medicine

## 2018-12-09 DIAGNOSIS — F17200 Nicotine dependence, unspecified, uncomplicated: Secondary | ICD-10-CM | POA: Insufficient documentation

## 2018-12-09 DIAGNOSIS — H9201 Otalgia, right ear: Secondary | ICD-10-CM | POA: Insufficient documentation

## 2018-12-09 MED ORDER — CETIRIZINE HCL 10 MG PO TABS: 10.0000 mg | ORAL_TABLET | Freq: Every day | ORAL | 0 refills | Status: AC

## 2018-12-09 MED ORDER — FLUTICASONE PROPIONATE 50 MCG/ACT NA SUSP: 2.0000 | Freq: Every day | NASAL | 0 refills | Status: AC

## 2018-12-09 MED ORDER — AZITHROMYCIN 250 MG PO TABS: ORAL_TABLET | ORAL | 0 refills | Status: AC

## 2018-12-09 NOTE — ED Triage Notes (Signed)
Patient states that she has had right ear pain times two weeks.

## 2018-12-09 NOTE — ED Notes (Signed)
See triage note  Presents with right ear pain for 2 weeks  No fever  And denies any drainage from ear

## 2018-12-09 NOTE — ED Provider Notes (Signed)
[facility] Emergency Department Provider Note  ____________________________________________  Time seen: Approximately 8:19 AM  I have reviewed the triage vital signs and the nursing notes.   HISTORY  Chief Complaint Otalgia    HPI Cassandra Graves is a 53 y.o. female presents emergency department for evaluation of right ear discomfort for 2 weeks.  No difficulty hearing out of ear.  No recent illness.  No trauma.  No drainage.  No fever, nasal congestion, sore throat.   Past Medical History:  Diagnosis Date  . Kidney stones     There are no active problems to display for this patient.   Past Surgical History:  Procedure Laterality Date  . kidney stones      Prior to Admission medications   Medication Sig Start Date End Date Taking? Authorizing Provider  azithromycin (ZITHROMAX Z-PAK) 250 MG tablet Take 2 tablets (500 mg) on  Day 1,  followed by 1 tablet (250 mg) once daily on Days 2 through 5. 12/09/18   [place] DerryWagner, Candice Lunney, PA-C  cetirizine (ZYRTEC ALLERGY) 10 MG tablet Take 1 tablet (10 mg total) by mouth daily. 12/09/18   Enid DerryWagner, Karrisa Didio, PA-C  fluticasone (FLONASE) 50 MCG/ACT nasal spray Place 2 sprays into both nostrils daily. 12/09/18 12/09/19  Enid DerryWagner, Yasheka Fossett, PA-C    Allergies Penicillins  No family history on file.  Social History Social History   Tobacco Use  . Smoking status: Current Some Day Smoker  . Smokeless tobacco: Never Used  Substance Use Topics  . Alcohol use: No    Frequency: Never  . Drug use: No     Review of Systems  Constitutional: No fever/chills Eyes: No visual changes. No discharge. ENT: Negative for congestion and rhinorrhea. Cardiovascular: No chest pain. Respiratory: Negative for cough. No SOB. Gastrointestinal: No abdominal pain.  No nausea, no vomiting.  Musculoskeletal: Negative for musculoskeletal pain. Skin: Negative for rash, abrasions, lacerations, ecchymosis. Neurological: Negative for  headaches.   ____________________________________________   PHYSICAL EXAM:  VITAL SIGNS: ED Triage Vitals  Enc Vitals Group     BP 12/09/18 0628 (!) 183/86     Pulse Rate 12/09/18 0628 88     Resp 12/09/18 0628 18     Temp 12/09/18 0628 98.9 F (37.2 C)     Temp Source 12/09/18 0628 Oral     SpO2 12/09/18 0628 97 %     Weight 12/09/18 0629 150 lb (68 kg)     Height 12/09/18 0629 5\' 2"  (1.575 m)     Head Circumference --      Peak Flow --      Pain Score 12/09/18 0629 5     Pain Loc --      Pain Edu? --      Excl. in GC? --      Constitutional: Alert and oriented. Well appearing and in no acute distress. Eyes: Conjunctivae are normal. PERRL. EOMI. No discharge. Head: Atraumatic. ENT: No frontal and maxillary sinus tenderness.      Ears: Right tympanic membrane is pink and bulging.  Left tympanic membrane is pearly and bulging.  No discharge.      Nose: No congestion/rhinnorhea.      Mouth/Throat: Mucous membranes are moist. Oropharynx non-erythematous. Tonsils not enlarged. No exudates. Uvula midline. Neck: No stridor.   Hematological/Lymphatic/Immunilogical: No cervical lymphadenopathy. Cardiovascular: Normal rate, regular rhythm.  Good peripheral circulation. Respiratory: Normal respiratory effort without tachypnea or retractions. Lungs CTAB. Good air entry to the bases with no decreased or absent breath  sounds. Gastrointestinal: Bowel sounds 4 quadrants. Soft and nontender to palpation. No guarding or rigidity. No palpable masses. No distention. Musculoskeletal: Full range of motion to all extremities. No gross deformities appreciated. Neurologic:  Normal speech and language. No gross focal neurologic deficits are appreciated.  Skin:  Skin is warm, dry and intact. No rash noted. Psychiatric: Mood and affect are normal. Speech and behavior are normal. Patient exhibits appropriate insight and judgement.   ____________________________________________   LABS (all labs  ordered are listed, but only abnormal results are displayed)  Labs Reviewed - No data to display ____________________________________________  EKG   ____________________________________________  RADIOLOGY   No results found.  ____________________________________________    PROCEDURES  Procedure(s) performed:    Procedures    Medications - No data to display   ____________________________________________   INITIAL IMPRESSION / ASSESSMENT AND PLAN / ED COURSE  Pertinent labs & imaging results that were available during my care of the patient were reviewed by me and considered in my medical decision making (see chart for details).  Review of the [place] CSRS was performed in accordance of the [place] prior to dispensing any controlled drugs.     Patient presented to the emergency department for evaluation of ear pain for 2 weeks.  Vital signs and exam are reassuring.  I suspect that patient has having discomfort from fluid in her ear so she will start Flonase and Zyrtec.  She will also be covered for an infection since her tympanic membrane is somewhat pink. Patient feels comfortable going home. Patient will be discharged home with prescriptions for Augmentin and Flonase. Patient is to follow up with primary care as needed or otherwise directed. Patient is given ED precautions to return to the ED for any worsening or new symptoms.  Cassandra Graves was evaluated in Emergency Department on 12/09/2018 for the symptoms described in the history of present illness. She was evaluated in the context of the global COVID-19 pandemic, which necessitated consideration that the patient might be at risk for infection with the SARS-CoV-2 virus that causes COVID-19. Institutional protocols and algorithms that pertain to the evaluation of patients at risk for COVID-19 are in a state of rapid change based on information released by regulatory bodies including the CDC and federal and state organizations.  These policies and algorithms were followed during the patient's care in the ED.     ____________________________________________  FINAL CLINICAL IMPRESSION(S) / ED DIAGNOSES  Final diagnoses:  Otalgia of right ear      NEW MEDICATIONS STARTED DURING THIS VISIT:  ED Discharge Orders         Ordered    azithromycin (ZITHROMAX Z-PAK) 250 MG tablet    Note to Pharmacy: Ear infection   12/09/18 0830    fluticasone (FLONASE) 50 MCG/ACT nasal spray  Daily     12/09/18 0830    cetirizine (ZYRTEC ALLERGY) 10 MG tablet  Daily     12/09/18 0831              This chart was dictated using voice recognition software/Dragon. Despite best efforts to proofread, errors can occur which can change the meaning. Any change was purely unintentional.    Laban Emperor, PA-C 12/09/18 1102    Lavonia Drafts, MD 12/09/18 [phone]

## 2019-04-15 ENCOUNTER — Emergency Department
Admission: EM | Admit: 2019-04-15 | Discharge: 2019-04-15 | Disposition: A | Discharge status: Home / Self Care | Payer: Self-pay | Attending: Emergency Medicine | Admitting: Emergency Medicine

## 2019-04-15 ENCOUNTER — Other Ambulatory Visit: Payer: Self-pay

## 2019-04-15 DIAGNOSIS — Z720 Tobacco use: Secondary | ICD-10-CM | POA: Insufficient documentation

## 2019-04-15 DIAGNOSIS — R112 Nausea with vomiting, unspecified: Secondary | ICD-10-CM | POA: Insufficient documentation

## 2019-04-15 DIAGNOSIS — Z20828 Contact with and (suspected) exposure to other viral communicable diseases: Secondary | ICD-10-CM | POA: Insufficient documentation

## 2019-04-15 LAB — CBC
HCT: 42 % (ref 36.0–46.0)
Hemoglobin: 13.9 g/dL (ref 12.0–15.0)
MCH: 27.8 pg (ref 26.0–34.0)
MCHC: 33.1 g/dL (ref 30.0–36.0)
MCV: 84 fL (ref 80.0–100.0)
Platelets: 258 10*3/uL (ref 150–400)
RBC: 5 MIL/uL (ref 3.87–5.11)
RDW: 12 % (ref 11.5–15.5)
WBC: 7.1 10*3/uL (ref 4.0–10.5)
nRBC: 0 % (ref 0.0–0.2)

## 2019-04-15 LAB — COMPREHENSIVE METABOLIC PANEL
ALT: 16 U/L (ref 0–44)
AST: 12 U/L — ABNORMAL LOW (ref 15–41)
Albumin: 3.7 g/dL (ref 3.5–5.0)
Alkaline Phosphatase: 101 U/L (ref 38–126)
Anion gap: 9 (ref 5–15)
BUN: 11 mg/dL (ref 6–20)
CO2: 29 mmol/L (ref 22–32)
Calcium: 9.5 mg/dL (ref 8.9–10.3)
Chloride: 104 mmol/L (ref 98–111)
Creatinine, Ser: 1.01 mg/dL — ABNORMAL HIGH (ref 0.44–1.00)
GFR calc Af Amer: >60 mL/min (ref >60)
GFR calc non Af Amer: >60 mL/min (ref >60)
Glucose, Bld: 210 mg/dL — ABNORMAL HIGH (ref 70–99)
Potassium: 3.8 mmol/L (ref 3.5–5.1)
Sodium: 142 mmol/L (ref 135–145)
Total Bilirubin: 0.6 mg/dL (ref 0.3–1.2)
Total Protein: 7.5 g/dL (ref 6.5–8.1)

## 2019-04-15 LAB — URINALYSIS, COMPLETE (UACMP) WITH MICROSCOPIC
Bacteria, UA: NONE SEEN
Bilirubin Urine: NEGATIVE
Glucose, UA: NEGATIVE mg/dL
Ketones, ur: NEGATIVE mg/dL
Nitrite: NEGATIVE
Protein, ur: NEGATIVE mg/dL
Specific Gravity, Urine: 1.015 (ref 1.005–1.030)
pH: 6 (ref 5.0–8.0)

## 2019-04-15 LAB — LIPASE, BLOOD: Lipase: 42 U/L (ref 11–51)

## 2019-04-15 MED ORDER — ONDANSETRON 4 MG PO TBDP: 4.0000 mg | ORAL_TABLET | Freq: Three times a day (TID) | ORAL | 0 refills | Status: AC | PRN

## 2019-04-15 MED ORDER — SODIUM CHLORIDE 0.9% FLUSH: 3.0000 mL | Freq: Once | INTRAVENOUS | Status: DC

## 2019-04-15 MED ORDER — ONDANSETRON 4 MG PO TBDP
4.0000 mg | ORAL_TABLET | Freq: Once | ORAL | Status: AC
  Administered 2019-04-15: 4 mg via ORAL
  Filled 2019-04-15: qty 1

## 2019-04-15 NOTE — ED Triage Notes (Signed)
Pt c/o nausea and vomiting since last night, denies pain at this time.

## 2019-04-15 NOTE — ED Provider Notes (Signed)
[facility] Emergency Department Provider Note       Time seen: ----------------------------------------- 1:57 PM on 04/15/2019 -----------------------------------------   I have reviewed the triage vital signs and the nursing notes.  HISTORY   Chief Complaint Emesis    HPI Cassandra Graves is a 53 y.o. female with a history of kidney stones who presents to the ED for nausea vomiting since last night.  Patient denies any specific pain at this time.  Patient was advised to come here by her work because she was throwing up this morning.  She denies any other complaints.  Past Medical History:  Diagnosis Date  . Kidney stones     There are no active problems to display for this patient.   Past Surgical History:  Procedure Laterality Date  . kidney stones      Allergies Penicillins  Social History Social History   Tobacco Use  . Smoking status: Current Some Day Smoker  . Smokeless tobacco: Never Used  Substance Use Topics  . Alcohol use: No    Frequency: Never  . Drug use: No   Review of Systems Constitutional: Negative for fever. Cardiovascular: Negative for chest pain. Respiratory: Negative for shortness of breath. Gastrointestinal: Negative for abdominal pain, positive for nausea vomiting Musculoskeletal: Negative for back pain. Skin: Negative for rash. Neurological: Negative for headaches, focal weakness or numbness.  All systems negative/normal/unremarkable except as stated in the HPI  ____________________________________________   PHYSICAL EXAM:  VITAL SIGNS: ED Triage Vitals  Enc Vitals Group     BP 04/15/19 1259 (!) 155/85     Pulse Rate 04/15/19 1259 78     Resp 04/15/19 1259 17     Temp 04/15/19 1259 98.2 F (36.8 C)     Temp Source 04/15/19 1259 Oral     SpO2 04/15/19 1259 100 %     Weight 04/15/19 1256 165 lb (74.8 kg)     Height 04/15/19 1256 5\' 2"  (1.575 m)     Head Circumference --      Peak Flow --       Pain Score 04/15/19 1256 0     Pain Loc --      Pain Edu? --      Excl. in [place]? --    Constitutional: Alert and oriented. Well appearing and in no distress. Eyes: Conjunctivae are normal. Normal extraocular movements. ENT      Head: Normocephalic and atraumatic.      Nose: No congestion/rhinnorhea.      Mouth/Throat: Mucous membranes are moist.      Neck: No stridor. Cardiovascular: Normal rate, regular rhythm. No murmurs, rubs, or gallops. Respiratory: Normal respiratory effort without tachypnea nor retractions. Breath sounds are clear and equal bilaterally. No wheezes/rales/rhonchi. Gastrointestinal: Soft and nontender. Normal bowel sounds Musculoskeletal: Nontender with normal range of motion in extremities. No lower extremity tenderness nor edema. Neurologic:  Normal speech and language. No gross focal neurologic deficits are appreciated.  Skin:  Skin is warm, dry and intact. No rash noted. Psychiatric: Mood and affect are normal. Speech and behavior are normal.  ____________________________________________  ED COURSE:  As part of my medical decision making, I reviewed the following data within the [place] History obtained from family if available, nursing notes, old chart and ekg, as well as notes from prior ED visits. Patient presented for nausea and vomiting, we will assess with labs and imaging as indicated at this time.   Procedures  Cassandra Graves was  evaluated in Emergency Department on 04/15/2019 for the symptoms described in the history of present illness. She was evaluated in the context of the global COVID-19 pandemic, which necessitated consideration that the patient might be at risk for infection with the SARS-CoV-2 virus that causes COVID-19. Institutional protocols and algorithms that pertain to the evaluation of patients at risk for COVID-19 are in a state of rapid change based on information released by regulatory bodies including the CDC and federal  and state organizations. These policies and algorithms were followed during the patient's care in the ED.  ____________________________________________   LABS (pertinent positives/negatives)  Labs Reviewed  COMPREHENSIVE METABOLIC PANEL - Abnormal; Notable for the following components:      Result Value   Glucose, Bld 210 (*)    Creatinine, Ser 1.01 (*)    AST 12 (*)    All other components within normal limits  URINALYSIS, COMPLETE (UACMP) WITH MICROSCOPIC - Abnormal; Notable for the following components:   Color, Urine YELLOW (*)    APPearance CLEAR (*)    Hgb urine dipstick SMALL (*)    Leukocytes,Ua TRACE (*)    All other components within normal limits  LIPASE, BLOOD  CBC  ___________________________________________   DIFFERENTIAL DIAGNOSIS   Gastritis, gastroenteritis, dehydration, electrolyte abnormality  FINAL ASSESSMENT AND PLAN  Nausea and vomiting   Plan: The patient had presented for nausea and vomiting. Patient's labs were reassuring, she has been advised of mild hyperglycemia.  She looks well and does not have vomiting or nausea currently.  She is cleared for outpatient follow-up.   Ulice Dash, MD    Note: This note was generated in part or whole with voice recognition software. Voice recognition is usually quite accurate but there are transcription errors that can and very often do occur. I apologize for any typographical errors that were not detected and corrected.     Cassandra Filbert, MD 04/15/19 [phone]

## 2019-04-16 LAB — SARS CORONAVIRUS 2 (TAT 6-24 HRS): SARS Coronavirus 2: NEGATIVE

## 2020-11-27 ENCOUNTER — Other Ambulatory Visit: Payer: Self-pay

## 2020-11-27 ENCOUNTER — Encounter: Payer: Self-pay | Admitting: *Deleted

## 2020-11-27 ENCOUNTER — Emergency Department
Admission: EM | Admit: 2020-11-27 | Discharge: 2020-11-27 | Disposition: A | Discharge status: Home / Self Care | Payer: Self-pay | Attending: Emergency Medicine | Admitting: Emergency Medicine

## 2020-11-27 DIAGNOSIS — F17210 Nicotine dependence, cigarettes, uncomplicated: Secondary | ICD-10-CM | POA: Insufficient documentation

## 2020-11-27 DIAGNOSIS — M5432 Sciatica, left side: Secondary | ICD-10-CM | POA: Insufficient documentation

## 2020-11-27 DIAGNOSIS — N319 Neuromuscular dysfunction of bladder, unspecified: Secondary | ICD-10-CM | POA: Insufficient documentation

## 2020-11-27 MED ORDER — ORPHENADRINE CITRATE ER 100 MG PO TB12: 100.0000 mg | ORAL_TABLET | Freq: Two times a day (BID) | ORAL | 0 refills | Status: AC

## 2020-11-27 MED ORDER — METHYLPREDNISOLONE 4 MG PO TBPK: ORAL_TABLET | ORAL | 0 refills | Status: AC

## 2020-11-27 MED ORDER — OXYCODONE-ACETAMINOPHEN 7.5-325 MG PO TABS: 1.0000 | ORAL_TABLET | Freq: Four times a day (QID) | ORAL | 0 refills | Status: AC | PRN

## 2020-11-27 NOTE — ED Triage Notes (Signed)
Per patient's report, lower back pain began 3 days ago and now radiates to left hip.

## 2020-11-27 NOTE — ED Provider Notes (Signed)
[facility] Emergency Department Provider Note   ____________________________________________   Event Date/Time   First MD Initiated Contact with Patient 11/27/20 1144     (approximate)  I have reviewed the triage vital signs and the nursing notes.   HISTORY  Chief Complaint Back Pain (Radiates to left hip)    HPI Cassandra Graves is a 55 y.o. female patient complain of radicular back pain to the left lower extremity for 3 days.  Patient has bladder bowel dysfunction.  Patient denies flank pain or material..  Patient rates the pain as a 6/10.  Patient described pain as "achy".  No palliative measure for complaint.         Past Medical History:  Diagnosis Date   Kidney stones     There are no problems to display for this patient.   Past Surgical History:  Procedure Laterality Date   kidney stones      Prior to Admission medications   Medication Sig Start Date End Date Taking? Authorizing Provider  methylPREDNISolone (MEDROL DOSEPAK) 4 MG TBPK tablet Take Tapered dose as directed 11/27/20  Yes Joni Reining, PA-C  orphenadrine (NORFLEX) 100 MG tablet Take 1 tablet (100 mg total) by mouth 2 (two) times daily. 11/27/20  Yes Joni Reining, PA-C  oxyCODONE-acetaminophen (PERCOCET) 7.5-325 MG tablet Take 1 tablet by mouth every 6 (six) hours as needed for severe pain. 11/27/20  Yes Joni Reining, PA-C  azithromycin (ZITHROMAX Z-PAK) 250 MG tablet Take 2 tablets (500 mg) on  Day 1,  followed by 1 tablet (250 mg) once daily on Days 2 through 5. 12/09/18   Enid Derry, PA-C  cetirizine (ZYRTEC ALLERGY) 10 MG tablet Take 1 tablet (10 mg total) by mouth daily. 12/09/18   Enid Derry, PA-C  fluticasone (FLONASE) 50 MCG/ACT nasal spray Place 2 sprays into both nostrils daily. 12/09/18 12/09/19  Enid Derry, PA-C  ondansetron (ZOFRAN ODT) 4 MG disintegrating tablet Take 1 tablet (4 mg total) by mouth every 8 (eight) hours as needed for nausea or  vomiting. 04/15/19   Emily Filbert, MD    Allergies Penicillins and Aspirin  No family history on file.  Social History Social History   Tobacco Use   Smoking status: Every Day    Pack years: 0.00    Types: Cigarettes   Smokeless tobacco: Never  Vaping Use   Vaping Use: Never used  Substance Use Topics   Alcohol use: No   Drug use: No    Review of Systems  Constitutional: No fever/chills Eyes: No visual changes. ENT: No sore throat. Cardiovascular: Denies chest pain. Respiratory: Denies shortness of breath. Gastrointestinal: No abdominal pain.  No nausea, no vomiting.  No diarrhea.  No constipation. Genitourinary: Negative for dysuria. Musculoskeletal: Positive for back pain. Skin: Negative for rash. Neurological: Negative for headaches, focal weakness or numbness. Allergic/Immunilogical: Penicillin and aspirin  ____________________________________________   PHYSICAL EXAM:  VITAL SIGNS: ED Triage Vitals  Enc Vitals Group     BP 11/27/20 1136 (!) 183/82     Pulse Rate 11/27/20 1136 89     Resp 11/27/20 1136 16     Temp 11/27/20 1136 98 F (36.7 C)     Temp Source 11/27/20 1136 Oral     SpO2 11/27/20 1136 96 %     Weight 11/27/20 1139 164 lb (74.4 kg)     Height 11/27/20 1139 5\' 2"  (1.575 m)     Head Circumference --  Peak Flow --      Pain Score 11/27/20 1138 6     Pain Loc --      Pain Edu? --      Excl. in GC? --     Constitutional: Alert and oriented. Well appearing and in no acute distress. Neck:  No cervical spine tenderness to palpation. Hematological/Lymphatic/Immunilogical: No cervical lymphadenopathy. Cardiovascular: Normal rate, regular rhythm. Grossly normal heart sounds.  Good peripheral circulation. Respiratory: Normal respiratory effort.  No retractions. Lungs CTAB. Gastrointestinal: Soft and nontender. No distention. No abdominal bruits. No CVA tenderness. Genitourinary: Deferred Musculoskeletal: No obvious deformities of  the lumbar spine.  No guarding palpation spinal processes.  Patient is moderate guarding palpation posterior neurologic:  Normal speech and language. No gross focal neurologic deficits are appreciated. No gait instability. Skin:  Skin is warm, dry and intact. No rash noted. Psychiatric: Mood and affect are normal. Speech and behavior are normal.  ____________________________________________   LABS (all labs ordered are listed, but only abnormal results are displayed)  Labs Reviewed - No data to display ____________________________________________  EKG   ____________________________________________  RADIOLOGY I, Joni Reining, personally viewed and evaluated these images (plain radiographs) as part of my medical decision making, as well as reviewing the written report by the radiologist.  ED MD interpretation:    Official radiology report(s): No results found.  ____________________________________________   PROCEDURES  Procedure(s) performed (including Critical Care):  Procedures   ____________________________________________   INITIAL IMPRESSION / ASSESSMENT AND PLAN / ED COURSE  As part of my medical decision making, I reviewed the following data within the electronic MEDICAL RECORD NUMBER         Patient presents with radicular back pain to the left hip.  Patient complaint physical exam consistent with sciatica.  Patient given discharge care instruction advised take medication as directed.  Patient vies follow-up with PCP.     ____________________________________________   FINAL CLINICAL IMPRESSION(S) / ED DIAGNOSES  Final diagnoses:  Sciatica of left side     ED Discharge Orders          Ordered    methylPREDNISolone (MEDROL DOSEPAK) 4 MG TBPK tablet        11/27/20 1151    orphenadrine (NORFLEX) 100 MG tablet  2 times daily        11/27/20 1151    oxyCODONE-acetaminophen (PERCOCET) 7.5-325 MG tablet  Every 6 hours PRN        11/27/20 1151              Note:  This document was prepared using Dragon voice recognition software and may include unintentional dictation errors.    Joni Reining, PA-C 11/27/20 1157    Dionne Bucy, MD 11/27/20 1606

## 2020-11-27 NOTE — Discharge Instructions (Addendum)
Read and follow discharge care instruction.  Take medication as directed. 

## 2022-10-06 ENCOUNTER — Emergency Department: Payer: Self-pay

## 2022-10-06 ENCOUNTER — Other Ambulatory Visit: Payer: Self-pay

## 2022-10-06 ENCOUNTER — Emergency Department
Admission: EM | Admit: 2022-10-06 | Discharge: 2022-10-06 | Disposition: A | Discharge status: Home / Self Care | Payer: Self-pay | Attending: Emergency Medicine | Admitting: Emergency Medicine

## 2022-10-06 DIAGNOSIS — M7732 Calcaneal spur, left foot: Secondary | ICD-10-CM | POA: Insufficient documentation

## 2022-10-06 DIAGNOSIS — M722 Plantar fascial fibromatosis: Secondary | ICD-10-CM | POA: Insufficient documentation

## 2022-10-06 MED ORDER — MELOXICAM 15 MG PO TABS: 15.0000 mg | ORAL_TABLET | Freq: Every day | ORAL | 0 refills | Status: AC

## 2022-10-06 MED ORDER — THIAMINE HCL 100 MG/ML IJ SOLN
100.0000 mg | INTRAMUSCULAR | Status: DC
  Filled 2022-10-06: qty 2

## 2022-10-06 NOTE — Discharge Instructions (Addendum)
New prescription sent to pharmacy. Take medication as instructed. Apply ice to affected area.  Wear comfortable shoes.  Rest.  Use compressions over area.  Elevate foot is much as possible.

## 2022-10-06 NOTE — ED Provider Notes (Signed)
[facility] Emergency Department Provider Note     Event Date/Time   First MD Initiated Contact with Patient 10/06/22 1942     (approximate)   History   Foot Pain   HPI  Cassandra Graves is a 57 y.o. female presents to the ED with left foot pain for 6 days.  Patient states she walked to the bathroom Sunday morning and felt a sudden severe burning pain in her left heel. Pain is worse when she walks.  She took Advil for pain with minimal relief.  Denies injury or trauma.     Physical Exam   Triage Vital Signs: ED Triage Vitals  Enc Vitals Group     BP 10/06/22 1929 (!) 197/97     Pulse Rate 10/06/22 1927 80     Resp 10/06/22 1927 18     Temp 10/06/22 1927 98 F (36.7 C)     Temp Source 10/06/22 1927 Oral     SpO2 10/06/22 1927 97 %     Weight 10/06/22 1925 150 lb (68 kg)     Height 10/06/22 1925 5\' 2"  (1.575 m)     Head Circumference --      Peak Flow --      Pain Score 10/06/22 1925 6     Pain Loc --      Pain Edu? --      Excl. in GC? --     Most recent vital signs: Vitals:   10/06/22 1929 10/06/22 2109  BP: (!) 197/97 (!) 185/86  Pulse:  75  Resp:  16  Temp:  98.1 F (36.7 C)  SpO2:  99%    General Awake, no distress.  CV:  Good peripheral perfusion.  RESP:  Normal effort.  ABD:  No distention.  Other:  Tender to palpation over left calcaneus.  Full ROM of all joints.  Skin is dry, intact, clean.  ED Results / Procedures / Treatments   Labs (all labs ordered are listed, but only abnormal results are displayed) Labs Reviewed - No data to display  RADIOLOGY  ED Provider Interpretation: From that bone spur of inferior calcaneus  DG Foot Complete Left  Result Date: 10/06/2022 CLINICAL DATA:  Foot pain EXAM: LEFT FOOT - COMPLETE 3+ VIEW COMPARISON:  None Available. FINDINGS: There is a transverse linear lucency through the tip of the medial malleolus worrisome for age indeterminate fracture. No other fractures are seen. There  is mild degenerative narrowing of the first metatarsophalangeal joint. Joint spaces are otherwise well maintained. Soft tissues are within normal limits. Plantar and posterior calcaneal spurs are present. IMPRESSION: Transverse linear lucency through the tip of the medial malleolus worrisome for age indeterminate fracture. Electronically Signed   By: Darliss Cheney M.D.   On: 10/06/2022 20:06     PROCEDURES:  Critical Care performed: No  Procedures   MEDICATIONS ORDERED IN ED: Medications - No data to display    IMPRESSION / MDM / ASSESSMENT AND PLAN / ED COURSE  I reviewed the triage vital signs and the nursing notes.                             Differential diagnosis includes, but is not limited to, plantar fasciitis, bone spur, fracture, sprain.  Patient's presentation is most consistent with acute complicated illness / injury requiring diagnostic workup.  Patient's diagnosis is consistent with bone spur and plantar fasciitis.  Patient is advised  to wear comfortable shoes, and take breaks when having to stand for long periods of time.  Patient was given work note to rest foot.  Patient will be discharged home with prescriptions for lots of meloxicam 15 mg. Patient is to follow up with [facility] as needed or otherwise directed. Patient is given ED precautions to return to the ED for any worsening or new symptoms.     FINAL CLINICAL IMPRESSION(S) / ED DIAGNOSES   Final diagnoses:  Plantar fasciitis of left foot  Bone spur of inferior portion of left calcaneus     Rx / DC Orders   ED Discharge Orders          Ordered    meloxicam (MOBIC) 15 MG tablet  Daily        10/06/22 2049             Note:  This document was prepared using Dragon voice recognition software and may include unintentional dictation errors.    Romeo Apple, Jazalynn Mireles A, PA-C 10/07/22 0030    Georga Hacking, MD 10/07/22 2158

## 2022-10-06 NOTE — ED Provider Notes (Incomplete)
[facility] Emergency Department Provider Note     Event Date/Time   First MD Initiated Contact with Patient 10/06/22 1942     (approximate)   History   Foot Pain   HPI  Cassandra Graves is a 57 y.o. female presents to the ED with left foot pain for 6 days.  Patient states she walked to the bathroom Sunday morning and felt a sudden severe burning pain in her left heel. Pain is worse when she walks.  She took Advil for pain with minimal relief.  Denies injury or trauma.     Physical Exam   Triage Vital Signs: ED Triage Vitals  Enc Vitals Group     BP 10/06/22 1929 (!) 197/97     Pulse Rate 10/06/22 1927 80     Resp 10/06/22 1927 18     Temp 10/06/22 1927 98 F (36.7 C)     Temp Source 10/06/22 1927 Oral     SpO2 10/06/22 1927 97 %     Weight 10/06/22 1925 150 lb (68 kg)     Height 10/06/22 1925 5\' 2"  (1.575 m)     Head Circumference --      Peak Flow --      Pain Score 10/06/22 1925 6     Pain Loc --      Pain Edu? --      Excl. in GC? --     Most recent vital signs: Vitals:   10/06/22 1927 10/06/22 1929  BP:  (!) 197/97  Pulse: 80   Resp: 18   Temp: 98 F (36.7 C)   SpO2: 97%     General Awake, no distress.  CV:  Good peripheral perfusion.  RESP:  Normal effort.  ABD:  No distention.  Other:  Tender to palpation over left calcaneus.  Full ROM of all joints.   ED Results / Procedures / Treatments   Labs (all labs ordered are listed, but only abnormal results are displayed) Labs Reviewed - No data to display   EKG  ***  RADIOLOGY  {**I personally viewed and evaluated these images as part of my medical decision making, as well as reviewing the written report by the radiologist.  ED Provider Interpretation: ***  DG Foot Complete Left  Result Date: 10/06/2022 CLINICAL DATA:  Foot pain EXAM: LEFT FOOT - COMPLETE 3+ VIEW COMPARISON:  None Available. FINDINGS: There is a transverse linear lucency through the tip of the  medial malleolus worrisome for age indeterminate fracture. No other fractures are seen. There is mild degenerative narrowing of the first metatarsophalangeal joint. Joint spaces are otherwise well maintained. Soft tissues are within normal limits. Plantar and posterior calcaneal spurs are present. IMPRESSION: Transverse linear lucency through the tip of the medial malleolus worrisome for age indeterminate fracture. Electronically Signed   By: Darliss Cheney M.D.   On: 10/06/2022 20:06     PROCEDURES:  Critical Care performed: {CriticalCareYesNo:19197::"Yes, see critical care procedure note(s)","No"}  Procedures   MEDICATIONS ORDERED IN ED: Medications  thiamine (VITAMIN B1) injection 100 mg (has no administration in time range)     IMPRESSION / MDM / ASSESSMENT AND PLAN / ED COURSE  I reviewed the triage vital signs and the nursing notes.                              Differential diagnosis includes, but is not limited to, plantar fasciitis, bone  spur, fracture, sprain.  Patient's presentation is most consistent with acute complicated illness / injury requiring diagnostic workup.  Patient's diagnosis is consistent with bone spur and plantar fasciitis. Patient will be discharged home with prescriptions for lots of meloxicam 15 mg. Patient is to follow up with [facility] as needed or otherwise directed. Patient is given ED precautions to return to the ED for any worsening or new symptoms.     FINAL CLINICAL IMPRESSION(S) / ED DIAGNOSES   Final diagnoses:  Plantar fasciitis of left foot  Bone spur of inferior portion of left calcaneus     Rx / DC Orders   ED Discharge Orders          Ordered    meloxicam (MOBIC) 15 MG tablet  Daily        10/06/22 2049             Note:  This document was prepared using Dragon voice recognition software and may include unintentional dictation errors.

## 2022-10-06 NOTE — ED Triage Notes (Signed)
Pt ambulatory to ED via POV c/o left foot pain. Pt states started last Sunday, hurts on the heel. Was swollen before. No redness swelling noted now. Denies any injury to foot. Denies CP, SOB

## 2024-04-01 ENCOUNTER — Encounter: Payer: Self-pay | Admitting: Emergency Medicine

## 2024-04-01 ENCOUNTER — Other Ambulatory Visit: Payer: Self-pay

## 2024-04-01 ENCOUNTER — Emergency Department

## 2024-04-01 ENCOUNTER — Emergency Department
Admission: EM | Admit: 2024-04-01 | Discharge: 2024-04-01 | Disposition: A | Discharge status: Home / Self Care | Attending: Emergency Medicine | Admitting: Emergency Medicine

## 2024-04-01 DIAGNOSIS — M7989 Other specified soft tissue disorders: Secondary | ICD-10-CM | POA: Diagnosis not present

## 2024-04-01 DIAGNOSIS — E119 Type 2 diabetes mellitus without complications: Secondary | ICD-10-CM | POA: Diagnosis not present

## 2024-04-01 DIAGNOSIS — I10 Essential (primary) hypertension: Secondary | ICD-10-CM | POA: Diagnosis not present

## 2024-04-01 DIAGNOSIS — M79672 Pain in left foot: Secondary | ICD-10-CM | POA: Insufficient documentation

## 2024-04-01 HISTORY — DX: Essential (primary) hypertension: I10

## 2024-04-01 HISTORY — DX: Type 2 diabetes mellitus without complications: E11.9

## 2024-04-01 NOTE — ED Triage Notes (Signed)
 Patient to ED via POV for left foot pain. States the top specifically. Ongoing for a couple days. Denies injury.

## 2024-04-01 NOTE — ED Provider Notes (Signed)
 [facility] Emergency Department Provider Note     Event Date/Time   First MD Initiated Contact with Patient 04/01/24 1737     (approximate)   History   Foot Pain   HPI  Cassandra Graves is a 58 y.o. female with a past medical history of diabetes, HTN presents to the ED for evaluation of left localized foot pain x 4 days.  Patient denies known injuries or trauma.  Negative calf pain.  She reports pain improves with Tylenol  and wearing a boot.  She reports she no longer has this walking boot.      Physical Exam   Triage Vital Signs: ED Triage Vitals  Encounter Vitals Group     BP 04/01/24 1658 (!) 188/95     Girls Systolic BP Percentile --      Girls Diastolic BP Percentile --      Boys Systolic BP Percentile --      Boys Diastolic BP Percentile --      Pulse Rate 04/01/24 1658 78     Resp 04/01/24 1658 16     Temp 04/01/24 1658 98.5 F (36.9 C)     Temp Source 04/01/24 1658 Oral     SpO2 04/01/24 1658 100 %     Weight 04/01/24 1658 155 lb (70.3 kg)     Height 04/01/24 1658 5' 2 (1.575 m)     Head Circumference --      Peak Flow --      Pain Score 04/01/24 1708 5     Pain Loc --      Pain Education --      Exclude from Growth Chart --     Most recent vital signs: Vitals:   04/01/24 1937 04/01/24 2042  BP: (!) 184/81 (!) 182/86  Pulse: 66 73  Resp: 16 16  Temp:    SpO2: 99% 98%    General Awake, no distress.  HEENT NCAT.  CV:  Good peripheral perfusion.  RESP:  Normal effort.  ABD:  No distention.  Other:  Left foot reveals no visible deformity there is mild swelling.  No pitting edema.  Full range of motion of digits.  Tenderness to palpation to midfoot.  Neurovascular status intact.  Pedal pulses palpated.  Patient is ambulatory.   ED Results / Procedures / Treatments   Labs (all labs ordered are listed, but only abnormal results are displayed) Labs Reviewed - No data to display  RADIOLOGY  I personally viewed and  evaluated these images as part of my medical decision making, as well as reviewing the written report by the radiologist.  ED Provider Interpretation: No acute bony abnormality  No results found.   PROCEDURES:  Critical Care performed: No  Procedures   MEDICATIONS ORDERED IN ED: Medications - No data to display   IMPRESSION / MDM / ASSESSMENT AND PLAN / ED COURSE  I reviewed the triage vital signs and the nursing notes.                               58 y.o. female presents to the emergency department for evaluation and treatment of left foot pain. See HPI for further details.   Differential diagnosis includes, but is not limited to fracture, dislocation, sprain, plantar fasciitis, arthritis  Patient's presentation is most consistent with acute complicated illness / injury requiring diagnostic workup.  Patient is alert and oriented.  She is  hemodynamic stable.  Noted elevated BP of 182/86.  Physical exam findings are stated above.  Reassuring x-ray.  Will refer to podiatry for further evaluation.  RICE education provided.  Patient stable condition for discharge home.  Patient provided with walking cam boot.  ED return precaution discussed.  FINAL CLINICAL IMPRESSION(S) / ED DIAGNOSES   Final diagnoses:  Foot pain, left   Rx / DC Orders   ED Discharge Orders     None      Note:  This document was prepared using Dragon voice recognition software and may include unintentional dictation errors.    [name], [name] A, PA-C 04/02/24 2335    [name], MD 04/11/24 [phone]

## 2024-04-01 NOTE — ED Provider Triage Note (Signed)
 Emergency Medicine Provider Triage Evaluation Note  Cassandra Graves , a 58 y.o. female  was evaluated in triage.  Pt complains of left foot and ankle pain x 3 days. No fall or injury. 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 Hx HTN.   Physical Exam  BP (!) 188/95 (BP Location: Left Arm)   Pulse 78   Temp 98.5 F (36.9 C) (Oral)   Resp 16   Ht 5' 2 (1.575 m)   Wt 70.3 kg   SpO2 100%   BMI 28.35 kg/m  Gen:   Awake, no distress   Resp:  Normal effort  MSK:   Moves extremities without difficulty  Other:  Full ROM of b/l ankles and toes. Minor swelling on front of left medial aspect of ankle. TTP along this area as well as proximal to the 2nd metatarsal.  Medical Decision Making  Medically screening exam initiated at 5:11 PM.  Appropriate orders placed.  Cassandra Graves was informed that the remainder of the evaluation will be completed by another provider, this initial triage assessment does not replace that evaluation, and the importance of remaining in the ED until their evaluation is complete.  X rays left ankle and foot ordered. Will need repeat BP once roomed.   Sheron [place], [place] 04/01/24 [phone]

## 2024-04-01 NOTE — Discharge Instructions (Addendum)
 Your evaluated in the ED for left foot pain.  Your ankle x-ray and foot x-ray are normal with the exception of mild tibiotalar osteoarthritis and inferior and superior calcaneal spurs which could be contributing to your pain.  You will need to continue wearing compression socks.  Apply ice over the affected area to help with swelling.  Elevate as much as possible above heart level to reduce swelling.  Please follow-up with podiatry for further evaluation.  Pain control:  Acetaminophen  (tylenol ) - You can take 2 extra strength tablets (1000 mg) every 6 hours as needed for pain/fever.  You can alternate these medications or take them together.  Make sure you eat food/drink water when taking these medications.
